# Patient Record
Sex: Female | Born: 2000 | Hispanic: Yes | Marital: Single | Smoking: Never smoker
Health system: Southern US, Community
[De-identification: ages and names within clinical notes are randomized; demographics above are authoritative.]

## PROBLEM LIST (undated history)

## (undated) DIAGNOSIS — H1010 Acute atopic conjunctivitis, unspecified eye: Secondary | ICD-10-CM

## (undated) DIAGNOSIS — Q798 Other congenital malformations of musculoskeletal system: Secondary | ICD-10-CM

## (undated) HISTORY — DX: Acute atopic conjunctivitis, unspecified eye: H10.10

## (undated) HISTORY — PX: NO PAST SURGERIES: SHX2092

---
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
## 2022-01-07 ENCOUNTER — Ambulatory Visit (INDEPENDENT_AMBULATORY_CARE_PROVIDER_SITE_OTHER): Payer: Medicaid Other | Admitting: Physician Assistant

## 2022-01-07 ENCOUNTER — Encounter: Payer: Medicaid Other | Admitting: Physician Assistant

## 2022-01-07 DIAGNOSIS — Q798 Other congenital malformations of musculoskeletal system: Secondary | ICD-10-CM

## 2022-01-07 NOTE — Progress Notes (Signed)
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 Ulice Bold who presents to clinic for postoperative follow-up.  Reviewed operative report and 60 cc was placed into the 300 cc expander.  She was last seen here in clinic on 12/31/2021.  At that time, she was doing well and exam was entirely reassuring.  50 cc was injected into her expander for a total of 110/300 cc.  Plan was for her to return in 1 week for drain removal.  Today, patient is doing well.  Mother at bedside.  Interpreter services also at bedside.  She has had less than 10 cc/day drainage from her JP drain for the past week.  She is hoping they can be removed today.  She also feels prepared for expander fill.  On physical exam, expander in place on chest wall.  No overlying redness or other skin changes.  No subcutaneous fluid collections noted.  Skin is not too taut.  JP drain intact and functional, normal-appearing drainage in bulb.  JP drain is removed without complication or difficulty.  We placed injectable saline in the Expander using a sterile technique: Right: 50 cc for a total of 160 / 330 cc  She already has follow-up planned with Dr. Ulice Bold in 2 weeks.  At that time, she will reassess with a not she would like additional fill.  She is going for symmetry with left breast.  Likely photos at that time.  Patient to call the clinic should she develop any questions or concerns in interim.

## 2022-01-12 NOTE — Progress Notes (Signed)
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  Patient underwent right breast reconstruction with placement of acellular dermal matrix and tissue expander with Dr. Ulice Bold on 12/23/2021.  Patient presents for postoperative follow-up.  Patient was last seen in the clinic on 01/07/2022.  At this visit, patient was doing well.  On exam, there is no subcutaneous fluid or overlying redness.  The left drain that was in place was removed.  Patient underwent expander fill including 50 cc for a total of 160 cc / 330 cc on the right side.  Today, patient is accompanied by her mother.  There is an interpreter present.  Patient reports she is doing really well.  She has no new complaints or concerns.  She denies any issues at the surgical site.  She denies any swelling, redness or drainage.  She states her pain is controlled.  Patient reports that she did well after her last fill.  She would like another fill today.  Patient feels that she is getting close to the size of her other breast.  Chaperone present on exam.  On exam, patient sitting upright in no acute distress.  Right breast is soft and expanders in place.  There is no erythema.  There is no fluid collections noted on exam.  Incision intact with Steri-Strips.  Steri-Strips were removed.  Incision appears to be healing well.  There is no surrounding erythema or drainage.  There is a small Monocryl suture knot that was noted.  This was trimmed.  Patient tolerated well.  We placed injectable saline in the Expander using a sterile technique: Right: 30 cc for a total of 190 cc / 330 cc  Discussed with patient she can place Vaseline daily over the incision site.  Discussed with patient to not submerge the incision.  Discussed with patient to continue to avoid heavy lifting and overhead motion with her arms.  Patient to follow-up in 3 weeks with Dr. Ulice Bold to discuss planning for possible expander  removal and placement of implant and to evaluate the patient.  Pictures were obtained of the patient and placed in the patient's chart with the patient's permission.  All of the patient's and the patient's mother's questions were answered.  Patient also examined by Dr. Ulice Bold.

## 2022-01-14 ENCOUNTER — Ambulatory Visit (INDEPENDENT_AMBULATORY_CARE_PROVIDER_SITE_OTHER): Payer: Medicaid Other | Admitting: Student

## 2022-01-14 ENCOUNTER — Encounter: Payer: Self-pay | Admitting: Plastic Surgery

## 2022-01-14 DIAGNOSIS — Q798 Other congenital malformations of musculoskeletal system: Secondary | ICD-10-CM

## 2022-02-01 ENCOUNTER — Encounter: Payer: Self-pay | Admitting: Plastic Surgery

## 2022-02-01 ENCOUNTER — Ambulatory Visit (INDEPENDENT_AMBULATORY_CARE_PROVIDER_SITE_OTHER): Payer: Medicaid Other | Admitting: Plastic Surgery

## 2022-02-01 DIAGNOSIS — Q839 Congenital malformation of breast, unspecified: Secondary | ICD-10-CM

## 2022-02-01 NOTE — Progress Notes (Signed)
   Subjective:    Patient ID: Sue Gilbert, female    DOB: 21-Mar-2001, 21 y.o.   MRN: [id number]  The patient is an 21 year old female here with mom for follow-up on her right breast reconstruction for [place] syndrome.  She had an expander placed in June and has been undergoing filling.  At the start of her visit she had 160 cc in the 330 cc expander.  She feels like the breasts match in size.      Review of Systems  Constitutional: Negative.   Eyes: Negative.   Cardiovascular: Negative.   Endocrine: Negative.   Genitourinary: Negative.        Objective:   Physical Exam Constitutional:      Appearance: Normal appearance.  Cardiovascular:     Rate and Rhythm: Normal rate.     Pulses: Normal pulses.  Pulmonary:     Effort: Pulmonary effort is normal.  Neurological:     Mental Status: She is alert and oriented to person, place, and time.  Psychiatric:        Mood and Affect: Mood normal.        Behavior: Behavior normal.        Thought Content: Thought content normal.         Assessment & Plan:     ICD-10-CM   1. Congenital breast deformity  Q83.9       We placed injectable saline in the Expander using a sterile technique: Right: 30 cc for a total of 190 / 300 cc.  I would like to see her back more time for a possible fill and we will get her scheduled for surgery in September.

## 2022-02-17 NOTE — Progress Notes (Signed)
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  Patient underwent right breast reconstruction with placement of acellular dermal matrix and tissue expander with Dr. Ulice Bold on 12/23/2021.  Patient presents for postoperative follow-up and possible expander fill.   Patient was last seen in the clinic on 02/01/22. At this visit, patient had her expander filled with 30 cc of injectable saline for a total of 190 cc / 300 cc. Plan was for patient to return for a possible fill and plan to get her on the schedule for surgery in September.   Today, mother is present with the patient and her visit today.  There is a Spanish interpreter in the room for the mother.  Patient today reports she is doing well.  She states that she sometimes has some soreness to her right breast which is positional.  She denies any issues with the area.  She denies any fevers or chills.  Patient reports she is currently happy with the size of her expander and would like to move forward with the implant exchange.  Chaperone present on exam.  On exam, patient is sitting upright in no acute distress.  The right expander is in place.  There is some very mild erythema to the medial aspect.  There are no cellulitic changes to the skin.  There are no underlying fluid collections palpated on exam.  The incision is intact.  There is some dark scaly skin to the inferior aspect of the right breast, which appears consistent with either irritated skin or residual Dermabond.  When asked about the erythema, patient reports that this happens sometimes soon after she wakes up and then eventually goes away.  Patient reports she has been sleeping on her right side.  I discussed with the patient to continue to monitor the area.  I discussed that she should avoid sleeping on that side as this may be causing some of her pain and redness.  I instructed the patient to call if if she notices any changes to  her right breast.  Patient expressed understanding.  Plan is to get patient on the surgery schedule for the implant exchange.  Patient to follow-up at her preop appointment prior to her surgery.  Pictures were obtained of the patient and placed in the chart with the patient's or guardian's permission.   Objective findings and plan discussed with Dr. Ulice Bold

## 2022-02-18 ENCOUNTER — Ambulatory Visit (INDEPENDENT_AMBULATORY_CARE_PROVIDER_SITE_OTHER): Payer: Medicaid Other | Admitting: Student

## 2022-02-18 ENCOUNTER — Encounter: Payer: Self-pay | Admitting: Student

## 2022-02-18 DIAGNOSIS — Q798 Other congenital malformations of musculoskeletal system: Secondary | ICD-10-CM

## 2022-02-22 ENCOUNTER — Telehealth: Payer: Self-pay | Admitting: *Deleted

## 2022-02-22 NOTE — Telephone Encounter (Signed)
Auth request submitted to [facility] Medicaid  [id number] pending as of 02/22/22

## 2022-02-28 ENCOUNTER — Telehealth: Payer: Self-pay | Admitting: *Deleted

## 2022-02-28 NOTE — Telephone Encounter (Signed)
Pre last visit message patient wanted surgery for 03/10/22 so I have scheduled her at 2pm on that day.   LVM for patient to let her know.

## 2022-02-28 NOTE — Telephone Encounter (Signed)
Auth approved for CPT [id number]  [id number] valid 02/22/22 - 05/23/22

## 2022-03-02 ENCOUNTER — Encounter (HOSPITAL_BASED_OUTPATIENT_CLINIC_OR_DEPARTMENT_OTHER): Payer: Self-pay | Admitting: Plastic Surgery

## 2022-03-02 ENCOUNTER — Other Ambulatory Visit: Payer: Self-pay

## 2022-03-03 ENCOUNTER — Encounter: Payer: Self-pay | Admitting: Student

## 2022-03-03 ENCOUNTER — Encounter: Payer: Medicaid Other | Admitting: Physician Assistant

## 2022-03-03 ENCOUNTER — Ambulatory Visit (INDEPENDENT_AMBULATORY_CARE_PROVIDER_SITE_OTHER): Payer: Medicaid Other | Admitting: Student

## 2022-03-03 VITALS — BP 113/71 | HR 73 | Ht 63.0 in | Wt 107.6 lb

## 2022-03-03 DIAGNOSIS — Q798 Other congenital malformations of musculoskeletal system: Secondary | ICD-10-CM

## 2022-03-03 MED ORDER — OXYCODONE HCL 5 MG PO TABS: 5.0000 mg | ORAL_TABLET | Freq: Three times a day (TID) | ORAL | 0 refills | Status: DC | PRN

## 2022-03-03 MED ORDER — ONDANSETRON HCL 4 MG PO TABS: 4.0000 mg | ORAL_TABLET | Freq: Three times a day (TID) | ORAL | 0 refills | Status: DC | PRN

## 2022-03-03 MED ORDER — CEPHALEXIN 500 MG PO CAPS: 500.0000 mg | ORAL_CAPSULE | Freq: Four times a day (QID) | ORAL | 0 refills | Status: AC

## 2022-03-03 NOTE — Progress Notes (Signed)
Patient ID: Sue Gilbert, female    DOB: [birth year], 21 y.o.   MRN: [id number]  Chief Complaint  Patient presents with   Pre-op Exam    No diagnosis found.   History of Present Illness: Sue Gilbert is a 21 y.o.  female  with a history of [place] syndrome.  She presents for preoperative evaluation for upcoming procedure, removal of right tissue expander and placement of implant, scheduled for 03/10/2022 with Dr. Ulice Bold.  Patient is accompanied by her mother today.  Patient states that she is able to interpret any questions that her mother may have during today's visit.  The patient has not had problems with anesthesia.  Patient denies any history of cardiac disease or use of blood thinners.  Patient states she is not a smoker.  Patient states that she is not taking any birth control or hormone replacement.  Patient denies any history of miscarriage.  Patient denies any personal or family history of blood clots or clotting diseases.  Patient denies any recent traumas, surgeries, pregnancies, infections, strokes or heart attacks.  Patient denies any history of inflammatory bowel disease, lung disease or cancer.  Patient denies any recent fevers, chills or shortness of breath.  Patient states that she would like her new implant to be symmetric to her other breast.  Summary of Previous Visit: Patient was last seen in the clinic on 02/18/2022.  Patient at this visit reported that she was happy with the size of her expander and would like to move forward with the implant exchange.  There is 190 cc / 300 cc in her right expander.  Job: Patient states she works at 2 jobs.  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  I discussed with the patient that if she needs any paperwork filled out or if she needs a letter for  either occupation to let us know.  PMH Significant for: [place] syndrome  Past Medical History: Allergies: No Known Allergies  Current Medications:  Current Outpatient Medications:    ondansetron (ZOFRAN) 4 MG tablet, Take 1 tablet (4 mg total) by mouth every 8 (eight) hours as needed for nausea or vomiting. (Patient not taking: Reported on 02/18/2022), Disp: 20 tablet, Rfl: 0  Past Medical Problems: Past Medical History:  Diagnosis Date   Allergic conjunctivitis    age 76   [place] syndrome     Past Surgical History: Past Surgical History:  Procedure Laterality Date   BREAST RECONSTRUCTION WITH PLACEMENT OF TISSUE EXPANDER AND FLEX HD (ACELLULAR HYDRATED DERMIS) Right 12/23/2021   Procedure: BREAST RECONSTRUCTION WITH PLACEMENT OF TISSUE EXPANDER AND FLEX HD (ACELLULAR HYDRATED DERMIS);  Surgeon: Peggye Form, DO;  Location: [facility];  Service: Plastics;  Laterality: Right;  2 hours    Social History: Social History   Socioeconomic History   Marital status: Single    Spouse name: Not on file   Number of children: Not on file   Years of education: Not on file   Highest education level: Not on file  Occupational History   Not on file  Tobacco Use   Smoking status: Never   Smokeless tobacco: Never  Vaping Use   Vaping Use: Never used  Substance and Sexual Activity   Alcohol use: Never  Drug use: Never   Sexual activity: Not on file  Other Topics Concern   Not on file  Social History Narrative   Not on file   Social Determinants of Health   Financial Resource Strain: Not on file  Food Insecurity: Not on file  Transportation Needs: Not on file  Physical Activity: Not on file  Stress: Not on file  Social Connections: Not on file  Intimate Partner Violence: Not on file    Family History: No family history on file.  Review of Systems: Denies fevers, chills or shortness of breath  Physical Exam: Vital Signs BP 113/71 (BP Location:  Left Arm, Patient Position: Sitting, Cuff Size: Normal)   Pulse 73   Ht 5\' 3"  (1.6 m)   Wt 107 lb 9.6 oz (48.8 kg)   LMP 01/11/2022   SpO2 98%   BMI 19.06 kg/m   Physical Exam  Constitutional:      General: Not in acute distress.    Appearance: Normal appearance. Not ill-appearing.  HENT:     Head: Normocephalic and atraumatic.  Neck:     Musculoskeletal: Normal range of motion.  Cardiovascular:     Rate and Rhythm: Normal rate Pulmonary:     Effort: Pulmonary effort is normal. No respiratory distress.  Musculoskeletal: Normal range of motion.  Lower extremities: Nonswollen, no varicose veins noted. Skin:    General: Skin is warm and dry.     Findings: No erythema or rash.  Neurological:     Mental Status: Alert and oriented to person, place, and time. Mental status is at baseline.  Psychiatric:        Mood and Affect: Mood normal.        Behavior: Behavior normal.    Assessment/Plan: The patient is scheduled for 03/10/2022 with Dr. 03/12/2022.  Risks, benefits, and alternatives of procedure discussed, questions answered and consent obtained.    Smoking Status: Non-smoker; Counseling Given?  N/A  Caprini Score: 2; Risk Factors include: length of planned surgery. Recommendation for mechanical prophylaxis. Encourage early ambulation.   Pictures obtained: 02/18/2022  Post-op Rx sent to pharmacy: Oxycodone, Zofran, Keflex  Patient was provided with the [facility] patient decision checklist, the National breast implant registry checklist and the General Surgical Risk consent document and Pain Medication Agreement prior to their appointment.  They had adequate time to read through the risk consent documents and Pain Medication Agreement. We also discussed them in person together during this preop appointment. All of their questions were answered to their satisfaction.  Recommended calling if they have any further questions.  Risk consent form and Pain Medication Agreement to be  scanned into patient's chart.  The risk that can be encountered with breast augmentation were discussed and include the following but not limited to these:  Breast asymmetry, fluid accumulation, firmness of the breast, inability to breast feed, loss of nipple or areola, skin loss, decrease or no nipple sensation, fat necrosis of the breast tissue, bleeding, infection, healing delay.  Deep vein thrombosis, cardiac and pulmonary complications are risks to any procedure. The implant can have a faulty position or one different from what you had desired.  The implant can have rippling, wrinkling, leakage or rupture. There are risks of anesthesia, changes to skin sensation and injury to nerves or blood vessels.  The muscle can be temporarily or permanently injured.  You may have an allergic reaction to tape, suture, glue, blood products which can result in skin discoloration, swelling, pain, skin lesions, poor healing.  Any of these can lead to the need for revisonal surgery or stage procedures.  A reduction has potential to interfere with diagnostic procedures.  Nipple or breast piercing can increase risks of infection.    This procedure is best done when the breast is fully developed.  Changes in the breast will continue to occur over time.  Pregnancy can alter the outcomes of previous breast reduction surgery, weight gain and weigh loss can also effect the long term appearance. Implants are not guaranteed to last a lifetime.  Future surgery may be required.  Regular examinations of the breast are required to evaluate the condition of your breasts and implants.    Electronically signed by: Laurena Spies, PA-C 03/03/2022 2:56 PM

## 2022-03-03 NOTE — H&P (View-Only) (Signed)
   Patient ID: Sue Gilbert, female    DOB: 06/08/2001, 20 y.o.   MRN: [id number]  Chief Complaint  Patient presents with   Pre-op Exam    No diagnosis found.   History of Present Illness: Sue Gilbert is a 20 y.o.  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  She presents for preoperative evaluation for upcoming procedure, removal of right tissue expander and placement of implant, scheduled for 03/10/2022 with Dr. Dillingham.  Patient is accompanied by her mother today.  Patient states that she is able to interpret any questions that her mother may have during today's visit.  The patient has not had problems with anesthesia.  Patient denies any history of cardiac disease or use of blood thinners.  Patient states she is not a smoker.  Patient states that she is not taking any birth control or hormone replacement.  Patient denies any history of miscarriage.  Patient denies any personal or family history of blood clots or clotting diseases.  Patient denies any recent traumas, surgeries, pregnancies, infections, strokes or heart attacks.  Patient denies any history of inflammatory bowel disease, lung disease or cancer.  Patient denies any recent fevers, chills or shortness of breath.  Patient states that she would like her new implant to be symmetric to her other breast.  Summary of Previous Visit: Patient was last seen in the clinic on 02/18/2022.  Patient at this visit reported that she was happy with the size of her expander and would like to move forward with the implant exchange.  There is 190 cc / 300 cc in her right expander.  Job: Patient states she works at 2 jobs.  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  I discussed with the patient that if she needs any paperwork filled out or if she needs a letter for  either occupation to let us know.  PMH Significant for: [place] syndrome  Past Medical History: Allergies: No Known Allergies  Current Medications:  Current Outpatient Medications:    ondansetron (ZOFRAN) 4 MG tablet, Take 1 tablet (4 mg total) by mouth every 8 (eight) hours as needed for nausea or vomiting. (Patient not taking: Reported on 02/18/2022), Disp: 20 tablet, Rfl: 0  Past Medical Problems: Past Medical History:  Diagnosis Date   Allergic conjunctivitis    age 12   [place] syndrome     Past Surgical History: Past Surgical History:  Procedure Laterality Date   BREAST RECONSTRUCTION WITH PLACEMENT OF TISSUE EXPANDER AND FLEX HD (ACELLULAR HYDRATED DERMIS) Right 12/23/2021   Procedure: BREAST RECONSTRUCTION WITH PLACEMENT OF TISSUE EXPANDER AND FLEX HD (ACELLULAR HYDRATED DERMIS);  Surgeon: Dillingham, Hannah Crill S, DO;  Location: [facility];  Service: Plastics;  Laterality: Right;  2 hours    Social History: Social History   Socioeconomic History   Marital status: Single    Spouse name: Not on file   Number of children: Not on file   Years of education: Not on file   Highest education level: Not on file  Occupational History   Not on file  Tobacco Use   Smoking status: Never   Smokeless tobacco: Never  Vaping Use   Vaping Use: Never used  Substance and Sexual Activity   Alcohol use: Never     Drug use: Never   Sexual activity: Not on file  Other Topics Concern   Not on file  Social History Narrative   Not on file   Social Determinants of Health   Financial Resource Strain: Not on file  Food Insecurity: Not on file  Transportation Needs: Not on file  Physical Activity: Not on file  Stress: Not on file  Social Connections: Not on file  Intimate Partner Violence: Not on file    Family History: No family history on file.  Review of Systems: Denies fevers, chills or shortness of breath  Physical Exam: Vital Signs BP 113/71 (BP Location:  Left Arm, Patient Position: Sitting, Cuff Size: Normal)   Pulse 73   Ht 5\' 3"  (1.6 m)   Wt 107 lb 9.6 oz (48.8 kg)   LMP 01/11/2022   SpO2 98%   BMI 19.06 kg/m   Physical Exam  Constitutional:      General: Not in acute distress.    Appearance: Normal appearance. Not ill-appearing.  HENT:     Head: Normocephalic and atraumatic.  Neck:     Musculoskeletal: Normal range of motion.  Cardiovascular:     Rate and Rhythm: Normal rate Pulmonary:     Effort: Pulmonary effort is normal. No respiratory distress.  Musculoskeletal: Normal range of motion.  Lower extremities: Nonswollen, no varicose veins noted. Skin:    General: Skin is warm and dry.     Findings: No erythema or rash.  Neurological:     Mental Status: Alert and oriented to person, place, and time. Mental status is at baseline.  Psychiatric:        Mood and Affect: Mood normal.        Behavior: Behavior normal.    Assessment/Plan: The patient is scheduled for 03/10/2022 with Dr. 03/12/2022.  Risks, benefits, and alternatives of procedure discussed, questions answered and consent obtained.    Smoking Status: Non-smoker; Counseling Given?  N/A  Caprini Score: 2; Risk Factors include: length of planned surgery. Recommendation for mechanical prophylaxis. Encourage early ambulation.   Pictures obtained: 02/18/2022  Post-op Rx sent to pharmacy: Oxycodone, Zofran, Keflex  Patient was provided with the Mentor patient decision checklist, the National breast implant registry checklist and the General Surgical Risk consent document and Pain Medication Agreement prior to their appointment.  They had adequate time to read through the risk consent documents and Pain Medication Agreement. We also discussed them in person together during this preop appointment. All of their questions were answered to their satisfaction.  Recommended calling if they have any further questions.  Risk consent form and Pain Medication Agreement to be  scanned into patient's chart.  The risk that can be encountered with breast augmentation were discussed and include the following but not limited to these:  Breast asymmetry, fluid accumulation, firmness of the breast, inability to breast feed, loss of nipple or areola, skin loss, decrease or no nipple sensation, fat necrosis of the breast tissue, bleeding, infection, healing delay.  Deep vein thrombosis, cardiac and pulmonary complications are risks to any procedure. The implant can have a faulty position or one different from what you had desired.  The implant can have rippling, wrinkling, leakage or rupture. There are risks of anesthesia, changes to skin sensation and injury to nerves or blood vessels.  The muscle can be temporarily or permanently injured.  You may have an allergic reaction to tape, suture, glue, blood products which can result in skin discoloration, swelling, pain, skin lesions, poor healing.  Any of these can lead to the need for revisonal surgery or stage procedures.  A reduction has potential to interfere with diagnostic procedures.  Nipple or breast piercing can increase risks of infection.    This procedure is best done when the breast is fully developed.  Changes in the breast will continue to occur over time.  Pregnancy can alter the outcomes of previous breast reduction surgery, weight gain and weigh loss can also effect the long term appearance. Implants are not guaranteed to last a lifetime.  Future surgery may be required.  Regular examinations of the breast are required to evaluate the condition of your breasts and implants.    Electronically signed by: Laurena Spies, PA-C 03/03/2022 2:56 PM

## 2022-03-10 ENCOUNTER — Other Ambulatory Visit: Payer: Self-pay

## 2022-03-10 ENCOUNTER — Encounter (HOSPITAL_BASED_OUTPATIENT_CLINIC_OR_DEPARTMENT_OTHER): Payer: Self-pay | Admitting: Plastic Surgery

## 2022-03-10 ENCOUNTER — Ambulatory Visit (HOSPITAL_BASED_OUTPATIENT_CLINIC_OR_DEPARTMENT_OTHER): Payer: Medicaid Other | Admitting: Anesthesiology

## 2022-03-10 ENCOUNTER — Ambulatory Visit (HOSPITAL_BASED_OUTPATIENT_CLINIC_OR_DEPARTMENT_OTHER)
Admission: RE | Admit: 2022-03-10 | Discharge: 2022-03-10 | Disposition: A | Discharge status: Home / Self Care | Payer: Medicaid Other | Source: Ambulatory Visit | Attending: Plastic Surgery | Admitting: Plastic Surgery

## 2022-03-10 ENCOUNTER — Encounter (HOSPITAL_BASED_OUTPATIENT_CLINIC_OR_DEPARTMENT_OTHER)
Admission: RE | Disposition: A | Discharge status: Home / Self Care | Payer: Self-pay | Source: Ambulatory Visit | Attending: Plastic Surgery

## 2022-03-10 DIAGNOSIS — Q839 Congenital malformation of breast, unspecified: Secondary | ICD-10-CM | POA: Diagnosis not present

## 2022-03-10 DIAGNOSIS — Q838 Other congenital malformations of breast: Secondary | ICD-10-CM

## 2022-03-10 DIAGNOSIS — Z01818 Encounter for other preprocedural examination: Secondary | ICD-10-CM

## 2022-03-10 DIAGNOSIS — Z9011 Acquired absence of right breast and nipple: Secondary | ICD-10-CM | POA: Diagnosis present

## 2022-03-10 DIAGNOSIS — Q798 Other congenital malformations of musculoskeletal system: Secondary | ICD-10-CM

## 2022-03-10 DIAGNOSIS — Z45811 Encounter for adjustment or removal of right breast implant: Secondary | ICD-10-CM | POA: Diagnosis not present

## 2022-03-10 HISTORY — PX: REMOVAL OF TISSUE EXPANDER AND PLACEMENT OF IMPLANT: SHX6457

## 2022-03-10 LAB — POCT PREGNANCY, URINE: Preg Test, Ur: NEGATIVE

## 2022-03-10 SURGERY — REMOVAL, TISSUE EXPANDER, BREAST, WITH IMPLANT INSERTION
Anesthesia: General | Site: Breast | Laterality: Right

## 2022-03-10 MED ORDER — FENTANYL CITRATE (PF) 100 MCG/2ML IJ SOLN: 25.0000 ug | INTRAMUSCULAR | Status: DC | PRN

## 2022-03-10 MED ORDER — OXYCODONE HCL 5 MG PO TABS
ORAL_TABLET | ORAL | Status: AC
  Filled 2022-03-10: qty 1

## 2022-03-10 MED ORDER — CEPHALEXIN 500 MG PO CAPS: 500.0000 mg | ORAL_CAPSULE | Freq: Four times a day (QID) | ORAL | 0 refills | Status: AC

## 2022-03-10 MED ORDER — DEXAMETHASONE SODIUM PHOSPHATE 4 MG/ML IJ SOLN
INTRAMUSCULAR | Status: DC | PRN
  Administered 2022-03-10: 4 mg via INTRAVENOUS

## 2022-03-10 MED ORDER — ONDANSETRON HCL 4 MG/2ML IJ SOLN
INTRAMUSCULAR | Status: AC
Start: 2022-03-10 — End: ?
  Filled 2022-03-10: qty 2

## 2022-03-10 MED ORDER — SODIUM CHLORIDE 0.9 % IV SOLN: INTRAVENOUS | Status: DC | PRN

## 2022-03-10 MED ORDER — SODIUM CHLORIDE 0.9 % IV SOLN
INTRAVENOUS | Status: AC
  Filled 2022-03-10: qty 10

## 2022-03-10 MED ORDER — CELECOXIB 200 MG PO CAPS: 200.0000 mg | ORAL_CAPSULE | Freq: Once | ORAL | Status: DC

## 2022-03-10 MED ORDER — ACETAMINOPHEN 10 MG/ML IV SOLN
1000.0000 mg | Freq: Once | INTRAVENOUS | Status: AC
  Administered 2022-03-10: 1000 mg via INTRAVENOUS

## 2022-03-10 MED ORDER — FENTANYL CITRATE (PF) 100 MCG/2ML IJ SOLN
INTRAMUSCULAR | Status: AC
  Filled 2022-03-10: qty 2

## 2022-03-10 MED ORDER — KETOROLAC TROMETHAMINE 15 MG/ML IJ SOLN
INTRAMUSCULAR | Status: AC
  Filled 2022-03-10: qty 1

## 2022-03-10 MED ORDER — ACETAMINOPHEN 10 MG/ML IV SOLN
INTRAVENOUS | Status: AC
  Filled 2022-03-10: qty 100

## 2022-03-10 MED ORDER — ACETAMINOPHEN 325 MG PO TABS: 650.0000 mg | ORAL_TABLET | ORAL | Status: DC | PRN

## 2022-03-10 MED ORDER — FENTANYL CITRATE (PF) 100 MCG/2ML IJ SOLN
INTRAMUSCULAR | Status: DC | PRN
  Administered 2022-03-10: 50 ug via INTRAVENOUS
  Administered 2022-03-10 (×2): 25 ug via INTRAVENOUS

## 2022-03-10 MED ORDER — ACETAMINOPHEN 500 MG PO TABS: 1000.0000 mg | ORAL_TABLET | Freq: Once | ORAL | Status: DC

## 2022-03-10 MED ORDER — MIDAZOLAM HCL 2 MG/2ML IJ SOLN
INTRAMUSCULAR | Status: AC
  Filled 2022-03-10: qty 2

## 2022-03-10 MED ORDER — LIDOCAINE HCL (CARDIAC) PF 100 MG/5ML IV SOSY
PREFILLED_SYRINGE | INTRAVENOUS | Status: DC | PRN
  Administered 2022-03-10: 60 mg via INTRAVENOUS

## 2022-03-10 MED ORDER — SODIUM CHLORIDE 0.9% FLUSH: 3.0000 mL | INTRAVENOUS | Status: DC | PRN

## 2022-03-10 MED ORDER — SODIUM CHLORIDE 0.9 % IV SOLN: 250.0000 mL | INTRAVENOUS | Status: DC | PRN

## 2022-03-10 MED ORDER — LIDOCAINE 2% (20 MG/ML) 5 ML SYRINGE
INTRAMUSCULAR | Status: AC
  Filled 2022-03-10: qty 5

## 2022-03-10 MED ORDER — OXYCODONE HCL 5 MG PO TABS
5.0000 mg | ORAL_TABLET | ORAL | Status: DC | PRN
  Administered 2022-03-10: 5 mg via ORAL

## 2022-03-10 MED ORDER — LACTATED RINGERS IV SOLN: INTRAVENOUS | Status: DC

## 2022-03-10 MED ORDER — MIDAZOLAM HCL 5 MG/5ML IJ SOLN
INTRAMUSCULAR | Status: DC | PRN
  Administered 2022-03-10: 2 mg via INTRAVENOUS

## 2022-03-10 MED ORDER — HYDROCODONE-ACETAMINOPHEN 5-325 MG PO TABS: 1.0000 | ORAL_TABLET | Freq: Three times a day (TID) | ORAL | 0 refills | Status: AC | PRN

## 2022-03-10 MED ORDER — PROPOFOL 10 MG/ML IV BOLUS
INTRAVENOUS | Status: DC | PRN
  Administered 2022-03-10: 130 mg via INTRAVENOUS

## 2022-03-10 MED ORDER — CHLORHEXIDINE GLUCONATE CLOTH 2 % EX PADS: 6.0000 | MEDICATED_PAD | Freq: Once | CUTANEOUS | Status: DC

## 2022-03-10 MED ORDER — BUPIVACAINE-EPINEPHRINE 0.25% -1:200000 IJ SOLN
INTRAMUSCULAR | Status: DC | PRN
  Administered 2022-03-10: 5 mL

## 2022-03-10 MED ORDER — BUPIVACAINE-EPINEPHRINE (PF) 0.25% -1:200000 IJ SOLN
INTRAMUSCULAR | Status: AC
  Filled 2022-03-10: qty 30

## 2022-03-10 MED ORDER — KETOROLAC TROMETHAMINE 30 MG/ML IJ SOLN
15.0000 mg | Freq: Once | INTRAMUSCULAR | Status: AC
  Administered 2022-03-10: 15 mg via INTRAVENOUS

## 2022-03-10 MED ORDER — DEXAMETHASONE SODIUM PHOSPHATE 10 MG/ML IJ SOLN
INTRAMUSCULAR | Status: AC
  Filled 2022-03-10: qty 1

## 2022-03-10 MED ORDER — SODIUM CHLORIDE 0.9% FLUSH: 3.0000 mL | Freq: Two times a day (BID) | INTRAVENOUS | Status: DC

## 2022-03-10 MED ORDER — DEXMEDETOMIDINE (PRECEDEX) IN NS 20 MCG/5ML (4 MCG/ML) IV SYRINGE
PREFILLED_SYRINGE | INTRAVENOUS | Status: DC | PRN
  Administered 2022-03-10 (×3): 4 ug via INTRAVENOUS

## 2022-03-10 MED ORDER — CEFAZOLIN SODIUM-DEXTROSE 2-4 GM/100ML-% IV SOLN
2.0000 g | INTRAVENOUS | Status: AC
  Administered 2022-03-10: 2 g via INTRAVENOUS

## 2022-03-10 MED ORDER — ACETAMINOPHEN 325 MG RE SUPP: 650.0000 mg | RECTAL | Status: DC | PRN

## 2022-03-10 MED ORDER — CEFAZOLIN SODIUM-DEXTROSE 2-4 GM/100ML-% IV SOLN
INTRAVENOUS | Status: AC
  Filled 2022-03-10: qty 100

## 2022-03-10 SURGICAL SUPPLY — 67 items
ADH SKN CLS APL DERMABOND .7 (GAUZE/BANDAGES/DRESSINGS) ×1
BAG DECANTER FOR FLEXI CONT (MISCELLANEOUS) ×1 IMPLANT
BINDER BREAST LRG (GAUZE/BANDAGES/DRESSINGS) IMPLANT
BINDER BREAST MEDIUM (GAUZE/BANDAGES/DRESSINGS) IMPLANT
BINDER BREAST XLRG (GAUZE/BANDAGES/DRESSINGS) IMPLANT
BINDER BREAST XXLRG (GAUZE/BANDAGES/DRESSINGS) IMPLANT
BIOPATCH RED 1 DISK 7.0 (GAUZE/BANDAGES/DRESSINGS) IMPLANT
BLADE SURG 15 STRL LF DISP TIS (BLADE) ×1 IMPLANT
BLADE SURG 15 STRL SS (BLADE) ×1
BNDG GAUZE DERMACEA FLUFF 4 (GAUZE/BANDAGES/DRESSINGS) ×2 IMPLANT
BNDG GZE DERMACEA 4 6PLY (GAUZE/BANDAGES/DRESSINGS)
CANISTER SUCT 1200ML W/VALVE (MISCELLANEOUS) ×1 IMPLANT
COVER BACK TABLE 60X90IN (DRAPES) ×1 IMPLANT
COVER MAYO STAND STRL (DRAPES) ×1 IMPLANT
DERMABOND ADVANCED (GAUZE/BANDAGES/DRESSINGS) ×1
DERMABOND ADVANCED .7 DNX12 (GAUZE/BANDAGES/DRESSINGS) IMPLANT
DRAIN CHANNEL 19F RND (DRAIN) IMPLANT
DRAPE LAPAROSCOPIC ABDOMINAL (DRAPES) ×1 IMPLANT
ELECT BLADE 4.0 EZ CLEAN MEGAD (MISCELLANEOUS) ×1
ELECT COATED BLADE 2.86 ST (ELECTRODE) ×1 IMPLANT
ELECT REM PT RETURN 9FT ADLT (ELECTROSURGICAL) ×1
ELECTRODE BLDE 4.0 EZ CLN MEGD (MISCELLANEOUS) ×1 IMPLANT
ELECTRODE REM PT RTRN 9FT ADLT (ELECTROSURGICAL) ×1 IMPLANT
EVACUATOR SILICONE 100CC (DRAIN) IMPLANT
FUNNEL KELLER 2 DISP (MISCELLANEOUS) IMPLANT
GAUZE PAD ABD 8X10 STRL (GAUZE/BANDAGES/DRESSINGS) ×2 IMPLANT
GAUZE SPONGE 4X4 12PLY STRL LF (GAUZE/BANDAGES/DRESSINGS) IMPLANT
GLOVE BIO SURGEON STRL SZ 6.5 (GLOVE) ×2 IMPLANT
GOWN STRL REUS W/ TWL LRG LVL3 (GOWN DISPOSABLE) ×2 IMPLANT
GOWN STRL REUS W/TWL LRG LVL3 (GOWN DISPOSABLE) ×3
IMPL BREAST MP 190CC (Breast) IMPLANT
IMPLANT BREAST MP 190CC (Breast) ×1 IMPLANT
IV NS 1000ML (IV SOLUTION)
IV NS 1000ML BAXH (IV SOLUTION) IMPLANT
NDL HYPO 25X1 1.5 SAFETY (NEEDLE) ×1 IMPLANT
NDL SAFETY ECLIP 18X1.5 (MISCELLANEOUS) ×1 IMPLANT
NEEDLE HYPO 25X1 1.5 SAFETY (NEEDLE) ×1 IMPLANT
PACK BASIN DAY SURGERY FS (CUSTOM PROCEDURE TRAY) ×1 IMPLANT
PENCIL SMOKE EVACUATOR (MISCELLANEOUS) ×1 IMPLANT
PIN SAFETY STERILE (MISCELLANEOUS) IMPLANT
SIZER BREAST REUSABLE 190CC (SIZER) ×1
SIZER BREAST REUSE 175CC (SIZER) ×1
SIZER BRST REUSABLE 190CC (SIZER) IMPLANT
SIZER BRST REUSE 175CC (SIZER) IMPLANT
SLEEVE SCD COMPRESS KNEE MED (STOCKING) ×1 IMPLANT
SPIKE FLUID TRANSFER (MISCELLANEOUS) IMPLANT
SPONGE T-LAP 18X18 ~~LOC~~+RFID (SPONGE) ×2 IMPLANT
STRIP SUTURE WOUND CLOSURE 1/2 (MISCELLANEOUS) IMPLANT
SUT MNCRL AB 3-0 PS2 18 (SUTURE) IMPLANT
SUT MNCRL AB 4-0 PS2 18 (SUTURE) ×1 IMPLANT
SUT MON AB 3-0 SH 27 (SUTURE) ×1
SUT MON AB 3-0 SH27 (SUTURE) ×1 IMPLANT
SUT MON AB 5-0 PS2 18 (SUTURE) IMPLANT
SUT PDS 3-0 CT2 (SUTURE) ×1
SUT PDS AB 2-0 CT2 27 (SUTURE) IMPLANT
SUT PDS II 3-0 CT2 27 ABS (SUTURE) IMPLANT
SUT SILK 3 0 PS 1 (SUTURE) IMPLANT
SUT VIC AB 3-0 SH 27 (SUTURE)
SUT VIC AB 3-0 SH 27X BRD (SUTURE) ×1 IMPLANT
SUT VICRYL 4-0 PS2 18IN ABS (SUTURE) ×1 IMPLANT
SYR BULB IRRIG 60ML STRL (SYRINGE) ×1 IMPLANT
SYR CONTROL 10ML LL (SYRINGE) IMPLANT
TOWEL GREEN STERILE FF (TOWEL DISPOSABLE) ×2 IMPLANT
TRAY DSU PREP LF (CUSTOM PROCEDURE TRAY) ×1 IMPLANT
TUBE CONNECTING 20X1/4 (TUBING) ×1 IMPLANT
UNDERPAD 30X36 HEAVY ABSORB (UNDERPADS AND DIAPERS) ×2 IMPLANT
YANKAUER SUCT BULB TIP NO VENT (SUCTIONS) ×1 IMPLANT

## 2022-03-10 NOTE — Anesthesia Preprocedure Evaluation (Signed)
Anesthesia Evaluation  Patient identified by MRN, date of birth, ID band Patient awake    Reviewed: Allergy & Precautions, NPO status , Patient's Chart, lab work & pertinent test results  Airway Mallampati: II  TM Distance: >3 FB Neck ROM: Full    Dental no notable dental hx.    Pulmonary neg pulmonary ROS,    Pulmonary exam normal        Cardiovascular negative cardio ROS   Rhythm:Regular Rate:Normal     Neuro/Psych negative neurological ROS  negative psych ROS   GI/Hepatic negative GI ROS, Neg liver ROS,   Endo/Other  negative endocrine ROS  Renal/GU negative Renal ROS  negative genitourinary   Musculoskeletal Congenital breast deformity   Abdominal Normal abdominal exam  (+)   Peds negative pediatric ROS (+)  Hematology negative hematology ROS (+)   Anesthesia Other Findings   Reproductive/Obstetrics                             Anesthesia Physical Anesthesia Plan  ASA: 2  Anesthesia Plan: General   Post-op Pain Management: Celebrex PO (pre-op)* and Tylenol PO (pre-op)*   Induction: Intravenous  PONV Risk Score and Plan: 3 and Ondansetron, Dexamethasone, Midazolam and Treatment may vary due to age or medical condition  Airway Management Planned: Mask and LMA  Additional Equipment: None  Intra-op Plan:   Post-operative Plan: Extubation in OR  Informed Consent: I have reviewed the patients History and Physical, chart, labs and discussed the procedure including the risks, benefits and alternatives for the proposed anesthesia with the patient or authorized representative who has indicated his/her understanding and acceptance.     Dental advisory given  Plan Discussed with: CRNA  Anesthesia Plan Comments: (Lab Results      Component                Value               Date                      PREGTESTUR               NEGATIVE            03/10/2022          )         Anesthesia Quick Evaluation

## 2022-03-10 NOTE — Op Note (Signed)
Op report Unilateral Breast Exchange   DATE OF OPERATION:  03/10/2022  LOCATION: [facility]  SURGICAL DIVISION: Plastic Surgery  PREOPERATIVE DIAGNOSES:  1. 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  2. Absence of Right breast  POSTOPERATIVE DIAGNOSES:  Same as preoperative diagnosis  PROCEDURE:  1. Exchange of tissue expander for implant. 2. Capsulotomies for implant respositioning.  SURGEON: Dolores Ewing Sanger Rayane Gallardo, DO  ASSISTANT: Burna Forts, PA  ANESTHESIA:  General.   COMPLICATIONS: None.   IMPLANTS: Mentor Smooth Moderate Plus Profile xtra Gel 190 cc. Ref #[id number].  Serial Number [id number]  INDICATIONS FOR PROCEDURE:  The patient, Sue Gilbert, is a 21 y.o. female born on [birth year], is here for further treatment after a placement of a tissue expander. She now presents for exchange of her expander for an implant.   MRN: [id number]  CONSENT:  Informed consent was obtained directly from the patient. Risks, benefits and alternatives were fully discussed. Specific risks including but not limited to bleeding, infection, hematoma, seroma, scarring, pain, implant infection, implant extrusion, capsular contracture, asymmetry, wound healing problems, and need for further surgery were all discussed. The patient did have an ample opportunity to have her questions answered to her satisfaction.   DESCRIPTION OF PROCEDURE:  The patient was taken to the operating room. SCDs were placed and IV antibiotics were given. The patient's chest was prepped and draped in a sterile fashion. A time out was performed and the implants to be used were identified.  One percent Xylocaine with epinephrine was used to infiltrate the area.   The previous incision was opened at the inframammary fold. The pectoralis was split to expose the tissue expander which was removed. Inspection of the pocket showed a normal healthy capsule and good integration of the biologic matrix.    Measurements were made to confirm adequate pocket size for the implant dimensions.  Hemostasis was ensured with electrocautery.  The pocket was irrigated with antibiotic solution.  New gloves were placed.  The implant was placed in the pocket with the Keller funnel and oriented appropriately. The capsule on the anterior surface was closed with a 3-0 running PDS suture. The remaining skin was closed with 3-0 Monocryl deep dermal and 4-0 Monocryl subcuticular stitches.  Dermabond was applied.  A breast binder and ABD was applied.  The patient was allowed to wake from anesthesia and taken to the recovery room in satisfactory condition.   The advanced practice practitioner (APP) assisted throughout the case.  The APP was essential in retraction and counter traction when needed to make the case progress smoothly.  This retraction and assistance made it possible to see the tissue plans for the procedure.  The assistance was needed for blood control, tissue re-approximation and assisted with closure of the incision site.

## 2022-03-10 NOTE — Discharge Instructions (Addendum)
INSTRUCTIONS FOR AFTER BREAST SURGERY   You will likely have some questions about what to expect following your operation.  The following information will help you and your family understand what to expect when you are discharged from the hospital.  Following these guidelines will help ensure a smooth recovery and reduce risks of complications.  Postoperative instructions include information on: diet, wound care, medications and physical activity.  AFTER SURGERY Expect to go home after the procedure.  In some cases, you may need to spend one night in the hospital for observation.  DIET Breast surgery does not require a specific diet.  However, the healthier you eat the better your body can start healing. It is important to increasing your protein intake.  This means limiting the foods with sugar and carbohydrates.  Focus on vegetables and some meat.  If you have any liposuction during your procedure be sure to drink water.  If your urine is bright yellow, then it is concentrated, and you need to drink more water.  As a general rule after surgery, you should have 8 ounces of water every hour while awake.  If you find you are persistently nauseated or unable to take in liquids let us know.  NO TOBACCO USE or EXPOSURE.  This will slow your healing process and increase the risk of a wound.  WOUND CARE Leave the binder on for 3 days . Use fragrance free soap.   After 3 days you can remove the binder to shower. Once dry apply ACE wrap, binder or sports bra.  Use a mild soap like Dial, Dove and Ivory. You may have Topifoam or Lipofoam on.  It is soft and spongy and helps keep you from getting creases if you have liposuction.  This can be removed before the shower and then replaced.  If you need more it is available on [employer] (Lipofoam). If you have steri-strips / tape directly attached to your skin leave them in place. It is OK to get these wet.   No baths, pools or hot tubs for four weeks. We close your  incision to leave the smallest and best-looking scar. No ointment or creams on your incisions until given the go ahead.  Especially not Neosporin (Too many skin reactions with this one).  A few weeks after surgery you can use Mederma and start massaging the scar. We ask you to wear your binder or sports bra for the first 6 weeks around the clock, including while sleeping. This provides added comfort and helps reduce the fluid accumulation at the surgery site.  ACTIVITY No heavy lifting until cleared by the doctor.  This usually means no more than a half-gallon of milk.  It is OK to walk and climb stairs. In fact, moving your legs is very important to decrease your risk of a blood clot.  It will also help keep you from getting deconditioned.  Every 1 to 2 hours get up and walk for 5 minutes. This will help with a quicker recovery back to normal.  Let pain be your guide so you don't do too much.  This is not the time for spring cleaning and don't plan on taking care of anyone else.  This time is for you to recover,  You will be more comfortable if you sleep and rest with your head elevated either with a few pillows under you or in a recliner.  No stomach sleeping for a three months.  WORK Everyone returns to work at different times.   As a rough guide, most people take at least 1 - 2 weeks off prior to returning to work. If you need documentation for your job, bring the forms to your postoperative follow up visit.  DRIVING Arrange for someone to bring you home from the hospital.  You may be able to drive a few days after surgery but not while taking any narcotics or valium.  BOWEL MOVEMENTS Constipation can occur after anesthesia and while taking pain medication.  It is important to stay ahead for your comfort.  We recommend taking Milk of Magnesia (2 tablespoons; twice a day) while taking the pain pills.  MEDICATIONS You may be prescribed should start after surgery At your preoperative visit for you  history and physical you may have been given the following medications: An antibiotic: Start this medication when you get home and take according to the instructions on the bottle. Zofran 4 mg:  This is to treat nausea and vomiting.  You can take this every 6 hours as needed and only if needed. Valium 2 mg: This is for muscle tightness if you have an implant or expander. This will help relax your muscle which also helps with pain control.  This can be taken every 12 hours as needed. Don't drive after taking this medication. Norco (hydrocodone/acetaminophen) 5/325 mg:  This is only to be used after you have taken the motrin or the tylenol. Every 8 hours as needed.   Over the counter Medication to take: Ibuprofen (Motrin) 600 mg:  Take this every 6 hours.  If you have additional pain then take 500 mg of the tylenol every 8 hours.  Only take the Norco after you have tried these two. Miralax or stool softener of choice: Take this according to the bottle if you take the Norco.  WHEN TO CALL Call your surgeon's office if any of the following occur: Fever 101 degrees F or greater Excessive bleeding or fluid from the incision site. Pain that increases over time without aid from the medications Redness, warmth, or pus draining from incision sites Persistent nausea or inability to take in liquids Severe misshapen area that underwent the operation.   Post Anesthesia Home Care Instructions  Activity: Get plenty of rest for the remainder of the day. A responsible individual must stay with you for 24 hours following the procedure.  For the next 24 hours, DO NOT: -Drive a car -Operate machinery -Drink alcoholic beverages -Take any medication unless instructed by your physician -Make any legal decisions or sign important papers.  Meals: Start with liquid foods such as gelatin or soup. Progress to regular foods as tolerated. Avoid greasy, spicy, heavy foods. If nausea and/or vomiting occur, drink  only clear liquids until the nausea and/or vomiting subsides. Call your physician if vomiting continues.  Special Instructions/Symptoms: Your throat may feel dry or sore from the anesthesia or the breathing tube placed in your throat during surgery. If this causes discomfort, gargle with warm salt water. The discomfort should disappear within 24 hours.  If you had a scopolamine patch placed behind your ear for the management of post- operative nausea and/or vomiting:  1. The medication in the patch is effective for 72 hours, after which it should be removed.  Wrap patch in a tissue and discard in the trash. Wash hands thoroughly with soap and water. 2. You may remove the patch earlier than 72 hours if you experience unpleasant side effects which may include dry mouth, dizziness or visual disturbances. 3. Avoid touching   the patch. Wash your hands with soap and water after contact with the patch.   

## 2022-03-10 NOTE — Interval H&P Note (Signed)
History and Physical Interval Note:  03/10/2022 2:17 PM  Sue Gilbert  has presented today for surgery, with the diagnosis of Congenital breast deformity, [place] Syndrome.  The various methods of treatment have been discussed with the patient and family. After consideration of risks, benefits and other options for treatment, the patient has consented to  Procedure(s) with comments: REMOVAL OF R TISSUE EXPANDER AND PLACEMENT OF IMPLANT (Right) - Requesting 1 hour as a surgical intervention.  The patient's history has been reviewed, patient examined, no change in status, stable for surgery.  I have reviewed the patient's chart and labs.  Questions were answered to the patient's satisfaction.     Alena Bills Gala Padovano

## 2022-03-10 NOTE — Anesthesia Procedure Notes (Signed)
Procedure Name: LMA Insertion Date/Time: 03/10/2022 3:00 PM  Performed by: Burna Cash, CRNAPre-anesthesia Checklist: Patient identified, Emergency Drugs available, Suction available and Patient being monitored Patient Re-evaluated:Patient Re-evaluated prior to induction Oxygen Delivery Method: Circle system utilized Preoxygenation: Pre-oxygenation with 100% oxygen Induction Type: IV induction Ventilation: Mask ventilation without difficulty LMA: LMA inserted LMA Size: 3.0 Number of attempts: 1 Airway Equipment and Method: Bite block Placement Confirmation: positive ETCO2 Tube secured with: Tape Dental Injury: Teeth and Oropharynx as per pre-operative assessment

## 2022-03-10 NOTE — Transfer of Care (Signed)
Immediate Anesthesia Transfer of Care Note  Patient: Sue Gilbert  Procedure(s) Performed: REMOVAL OF RIGHT TISSUE EXPANDER AND PLACEMENT OF IMPLANT (Right: Breast)  Patient Location: PACU  Anesthesia Type:General  Level of Consciousness: sedated  Airway & Oxygen Therapy: Patient Spontanous Breathing and Patient connected to face mask oxygen  Post-op Assessment: Report given to RN and Post -op Vital signs reviewed and stable  Post vital signs: Reviewed and stable  Last Vitals:  Vitals Value Taken Time  BP    Temp    Pulse 55 03/10/22 1547  Resp 25 03/10/22 1547  SpO2 100 % 03/10/22 1547  Vitals shown include unvalidated device data.  Last Pain:  Vitals:   03/10/22 1216  TempSrc: Oral  PainSc: 0-No pain         Complications: No notable events documented.

## 2022-03-14 ENCOUNTER — Encounter (HOSPITAL_BASED_OUTPATIENT_CLINIC_OR_DEPARTMENT_OTHER): Payer: Self-pay | Admitting: Plastic Surgery

## 2022-03-14 NOTE — Anesthesia Postprocedure Evaluation (Signed)
Anesthesia Post Note  Patient: Sue Gilbert  Procedure(s) Performed: REMOVAL OF RIGHT TISSUE EXPANDER AND PLACEMENT OF IMPLANT (Right: Breast)     Patient location during evaluation: PACU Anesthesia Type: General Level of consciousness: awake and alert Pain management: pain level controlled Vital Signs Assessment: post-procedure vital signs reviewed and stable Respiratory status: spontaneous breathing, nonlabored ventilation, respiratory function stable and patient connected to nasal cannula oxygen Cardiovascular status: blood pressure returned to baseline and stable Postop Assessment: no apparent nausea or vomiting Anesthetic complications: no   No notable events documented.  Last Vitals:  Vitals:   03/10/22 1615 03/10/22 1700  BP: 112/71 121/77  Pulse: (!) 49 77  Resp: 17   Temp:  36.6 C  SpO2: 100% 100%    Last Pain:  Vitals:   03/11/22 1040  TempSrc:   PainSc: 0-No pain                 Earl Lites P Adamaris King

## 2022-03-21 ENCOUNTER — Encounter: Payer: Self-pay | Admitting: Physician Assistant

## 2022-03-21 ENCOUNTER — Ambulatory Visit (INDEPENDENT_AMBULATORY_CARE_PROVIDER_SITE_OTHER): Payer: Medicaid Other | Admitting: Physician Assistant

## 2022-03-21 DIAGNOSIS — Q839 Congenital malformation of breast, unspecified: Secondary | ICD-10-CM

## 2022-03-21 NOTE — Progress Notes (Signed)
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 Ulice Bold.  She had a Mentor smooth moderate plus profile extra gel 190 cc implant placed.  Postoperatively the patient notes she is doing well, she denies any significant pain, she denies any infectious symptoms.  She does note that the right side seems a little smaller when compared to the left but overall is happy.  Chaperone present.  On exam right breast is round, soft, with no palpable fluid collections or area of firmness.  Incision is covered in Steri-Strips, no surrounding redness warmth or discharge.  Overall the patient appears to be doing very well.  She did have her honeycomb dressing removed, her Steri-Strips were left in place.  She is okay to shower and allow these to come off with time.  I would like to see her back in the office in 2 weeks for repeat evaluation.  She is given strict return precautions, both the patient and her mother verbalized understanding and agreement to today's plan had no further questions or concerns.  We will obtain postop photos at her next visit.

## 2022-04-05 ENCOUNTER — Encounter: Payer: Self-pay | Admitting: Physician Assistant

## 2022-04-05 ENCOUNTER — Ambulatory Visit (INDEPENDENT_AMBULATORY_CARE_PROVIDER_SITE_OTHER): Payer: Medicaid Other | Admitting: Physician Assistant

## 2022-04-05 DIAGNOSIS — Q839 Congenital malformation of breast, unspecified: Secondary | ICD-10-CM

## 2022-04-05 NOTE — Progress Notes (Signed)
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 Marla Roe.  She had a Mentor smooth moderate plus profile extra gel 190 cc implant placed.  Postoperatively she has done very well, she was seen in our office on 03/21/2022 and had no issues or concerns at that time.  Chaperone present, exam right breast is round soft with no palpable fluid collections or area of firmness.  Incisions are clean dry and intact with no surrounding redness warmth or discharge.  Photos place in chart today   Overall the patient is happy with her surgical outcome.  Dr. Marla Roe had the opportunity to come evaluate the patient.  The patient does feel that the right breast is slightly smaller than the left.  Given her anatomy it would be difficult to find perfect symmetry, we did discuss that if she is unhappy with the shape that some lipo filling would likely benefit.  At this point the patient is happy, we would like to see her back in our office for 1 month and then a subsequent 18-month follow-up.  The patient will follow-up immediately if she develops any new or worsening signs or symptoms.  She verbalized understanding and agreement to today's plan had no further questions or concerns.

## 2022-05-05 ENCOUNTER — Other Ambulatory Visit (HOSPITAL_COMMUNITY)
Admission: RE | Admit: 2022-05-05 | Discharge: 2022-05-05 | Disposition: A | Discharge status: Home / Self Care | Payer: Medicaid Other | Source: Ambulatory Visit | Attending: Pediatrics | Admitting: Pediatrics

## 2022-05-05 ENCOUNTER — Ambulatory Visit (INDEPENDENT_AMBULATORY_CARE_PROVIDER_SITE_OTHER): Payer: Medicaid Other | Admitting: Pediatrics

## 2022-05-05 ENCOUNTER — Encounter: Payer: Self-pay | Admitting: Pediatrics

## 2022-05-05 VITALS — BP 116/70 | Ht 63.5 in | Wt 105.4 lb

## 2022-05-05 DIAGNOSIS — Z1339 Encounter for screening examination for other mental health and behavioral disorders: Secondary | ICD-10-CM | POA: Diagnosis not present

## 2022-05-05 DIAGNOSIS — Z131 Encounter for screening for diabetes mellitus: Secondary | ICD-10-CM | POA: Diagnosis not present

## 2022-05-05 DIAGNOSIS — Z114 Encounter for screening for human immunodeficiency virus [HIV]: Secondary | ICD-10-CM

## 2022-05-05 DIAGNOSIS — Z23 Encounter for immunization: Secondary | ICD-10-CM

## 2022-05-05 DIAGNOSIS — Z68.41 Body mass index (BMI) pediatric, 5th percentile to less than 85th percentile for age: Secondary | ICD-10-CM

## 2022-05-05 DIAGNOSIS — Z113 Encounter for screening for infections with a predominantly sexual mode of transmission: Secondary | ICD-10-CM

## 2022-05-05 DIAGNOSIS — Z0000 Encounter for general adult medical examination without abnormal findings: Secondary | ICD-10-CM | POA: Diagnosis not present

## 2022-05-05 DIAGNOSIS — Z1331 Encounter for screening for depression: Secondary | ICD-10-CM

## 2022-05-05 LAB — POCT RAPID HIV: Rapid HIV, POC: NEGATIVE

## 2022-05-05 NOTE — Progress Notes (Signed)
Adolescent Well Care Visit Sue Gilbert is a 21 y.o. female who is here for well care.    PCP:  Sue Custard, MD   History was provided by the patient.  Current Issues: Current concerns include her mother is concerned that she may develop diabetes because of how much sugar she has in her diet.  Sue Gilbert's father has type 2 diabetes..   Nutrition/Exercise: Nutrition/Eating Behaviors: appetite is good Play any Sports?/ Exercise: walking at work a lot at work  Sleep:  Sleep: no concerns  Social Screening: Lives with:  parents Parental relations:  good Activities, Work, and Regulatory affairs officer?: has chores, works 2 jobs  Concerns regarding behavior with peers?  no Stressors of note: no  Education: School Name: [employer] Grade: first year - studying nursing  Menstruation:   No LMP recorded. (Menstrual status: Irregular Periods). Menstrual History: LMP was 04/22/22, has a period about every 5-6 weeks, no concerns   Confidential Social History: Tobacco?  no Secondhand smoke exposure?  no Drugs/ETOH?  no  Sexually Active?  no   Pregnancy Prevention: abstinence  Screenings: Patient has a dental home: yes  The patient completed the Rapid Assessment for Adolescent Preventive Services screening questionnaire and the following topics were identified as risk factors and discussed: none  In addition, the following topics were discussed as part of anticipatory guidance healthy eating, condom use, and birth control.  PHQ-9 completed and results indicated no signs of depression  Physical Exam:  Vitals:   05/05/22 1547  BP: 116/70  Weight: 105 lb 6 oz (47.8 kg)  Height: 5' 3.5" (1.613 m)   BP 116/70   Ht 5' 3.5" (1.613 m)   Wt 105 lb 6 oz (47.8 kg)   BMI 18.37 kg/m  Body mass index: body mass index is 18.37 kg/m. Blood pressure %iles are not available for patients who are 18 years or older.  Hearing Screening   500Hz  1000Hz  2000Hz  4000Hz   Right ear 20 20 20 20   Left  ear 20 20 20 20    Vision Screening   Right eye Left eye Both eyes  Without correction 20/20 20/20 20/20   With correction       General Appearance:   alert, oriented, no acute distress  HENT: Normocephalic, no obvious abnormality, conjunctiva clear  Mouth:   Normal appearing teeth, no obvious discoloration, dental caries, or dental caps  Neck:   Supple; thyroid: no enlargement, symmetric, no tenderness/mass/nodules  Chest Not examined  Lungs:   Clear to auscultation bilaterally, normal work of breathing  Heart:   Regular rate and rhythm, S1 and S2 normal, no murmurs;   Abdomen:   Soft, non-tender, no mass, or organomegaly  GU genitalia not examined  Musculoskeletal:   Tone and strength strong and symmetrical, all extremities               Lymphatic:   No cervical adenopathy  Skin/Hair/Nails:   Skin warm, dry and intact, no rashes, no bruises or petechiae  Neurologic:   Strength, gait, and coordination normal and age-appropriate     Assessment and Plan:   1. Encounter for general adult medical examination without abnormal findings  2. Body mass index, pediatric, 5th percentile to less than 85th percentile for age   24. Routine screening for STI (sexually transmitted infection) Patient denies sexual activity - at risk age group. - Urine cytology ancillary only  4. Screening for HIV (human immunodeficiency virus) Routine screening  - POCT Rapid HIV - negative  BMI is appropriate for age  Hearing screening result:normal Vision screening result: normal  Counseling provided for all of the vaccine components  Orders Placed This Encounter  Procedures   Flu Vaccine QUAD 19mo+IM (Fluarix, Fluzone & Alfiuria Quad PF)     Return if symptoms worsen or fail to improve.Sue End, MD

## 2022-05-05 NOTE — Patient Instructions (Signed)
Adult Primary Care Clinics Name Criteria Services   [facility]  Address: [address] [place], [place] [address]  Phone: [phone] Hours: Monday - Friday 9 AM -6 PM  Types of insurance accepted:  Commercial insurance [facility] (orange card) Medicaid Medicare Uninsured  Language services:  Video and phone interpreters available   Ages 18 and older    Adult primary care Onsite pharmacy Integrated behavioral health Financial assistance counseling Walk-in hours for established patients  Financial assistance counseling hours: Tuesdays 2:00PM - 5:00PM  Thursday 8:30AM - 4:30PM  Space is limited, 10 on Tuesday and 20 on Thursday on a first come, first serve basis  Name Criteria Services   [facility]  Address: [address] [place], [place] [address]  Phone: [phone]  Hours: Monday - Friday 8:30 AM - 5 PM  Types of insurance accepted:  Commercial insurance Medicaid Medicare Uninsured  Language services:  Video and phone interpreters available   All ages - newborn to adult   Primary care for all ages (children and adults) Integrated behavioral health Nutritionist Financial assistance counseling   Name Criteria Services   [facility]  Located on the ground floor of [facility]  Address: [address]  [place],  [place]  [address]  Phone: [phone]  Hours: Monday - Friday 8:15 AM - 5 PM  Types of insurance accepted:  Commercial insurance Medicaid Medicare Uninsured  Language services:  Video and phone interpreters available   Ages 18 and older   Adult primary care Nutritionist Certified Diabetes Educator  Integrated behavioral health Financial assistance counseling   Name Criteria Services   [facility]  Address: [address] [place], [place] [address]  Phone:  [phone]  Hours: Monday - Friday 8:30 AM - 5 PM    Types of insurance accepted:  Commercial insurance Medicaid Medicare Uninsured  Language services:  Video and phone interpreters available   All ages - newborn to adult   Primary care for all ages (children and adults) Integrated behavioral health Financial assistance counseling    

## 2022-05-06 LAB — URINE CYTOLOGY ANCILLARY ONLY
Chlamydia: NEGATIVE
Comment: NEGATIVE
Comment: NORMAL
Neisseria Gonorrhea: NEGATIVE

## 2022-05-11 ENCOUNTER — Ambulatory Visit (INDEPENDENT_AMBULATORY_CARE_PROVIDER_SITE_OTHER): Payer: Medicaid Other | Admitting: Physician Assistant

## 2022-05-11 ENCOUNTER — Encounter: Payer: Self-pay | Admitting: Physician Assistant

## 2022-05-11 DIAGNOSIS — Q839 Congenital malformation of breast, unspecified: Secondary | ICD-10-CM

## 2022-05-11 NOTE — Progress Notes (Signed)
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 Marla Roe.  She had a Mentor smooth moderate plus profile extra gel 190 cc implant placed.  Postoperatively she has done very well, she was last seen in the office on 04/05/2022.  Since that time she has had no concerns or complaints.  She notes she is happy with her surgical outcome and does not want any further intervention.  Chaperone present, on exam right breast is round with no overlying redness, incisions clean dry and intact with routine healing.  Overall the patient is happy with the surgical outcome she does not want any further intervention at this time.  She will follow-up in our office in 6 months for follow-up evaluation.  She will reach out sooner if she develops any concerning signs or symptoms.

## 2022-06-07 ENCOUNTER — Ambulatory Visit (INDEPENDENT_AMBULATORY_CARE_PROVIDER_SITE_OTHER): Payer: Medicaid Other | Admitting: Pediatrics

## 2022-06-07 ENCOUNTER — Encounter: Payer: Self-pay | Admitting: Pediatrics

## 2022-06-07 VITALS — Temp 98.4°F | Wt 107.4 lb

## 2022-06-07 DIAGNOSIS — R21 Rash and other nonspecific skin eruption: Secondary | ICD-10-CM | POA: Diagnosis not present

## 2022-06-07 MED ORDER — CETIRIZINE HCL 10 MG PO TABS: 10.0000 mg | ORAL_TABLET | Freq: Every day | ORAL | 2 refills | Status: AC | PRN

## 2022-06-07 NOTE — Progress Notes (Unsigned)
  Subjective:    Sue Gilbert is a 21 y.o. old female here with her {family members:11419} for Rash (Face rash ) .    HPI Chief Complaint  Patient presents with   Rash    Face rash    Rash on face and facial swelling for the past 2 days.  Also having itchinesss, dryness, and acne breakout.  She reports that this has happened 5 times in the past year (first episode was in February).  This is not associated with eating any certain foods.  Using cerave acne control cleanser for the past 5 months.  Using makeup and makeup remover.  Used a different foundation on Sunday when the rash started, but had the rash before starting to use this product.  She has tried applying chamomile.    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  Sue Gilbert  has a past medical history of Allergic conjunctivitis and [place] syndrome.  Immunizations needed: {NONE DEFAULTED:18576}     Objective:    Temp 98.4 F (36.9 C) (Oral)   Wt 107 lb 6.4 oz (48.7 kg)   LMP 04/05/2022   BMI 18.73 kg/m  Physical Exam     Assessment and Plan:   Sue Gilbert is a 21 y.o. old female with  ***   Return if symptoms worsen or fail to improve.  Clifton Custard, MD

## 2022-06-12 IMAGING — CR DG CHEST 2V
2 series · 2 of 2 positions shown · non-contrast
Comparison: None.

CLINICAL DATA: Chest deformity with congenital right breast
absence. [place] syndrome. Preoperative study for reconstruction
surgery.

EXAM:
CHEST - 2 VIEW

[chest pa]
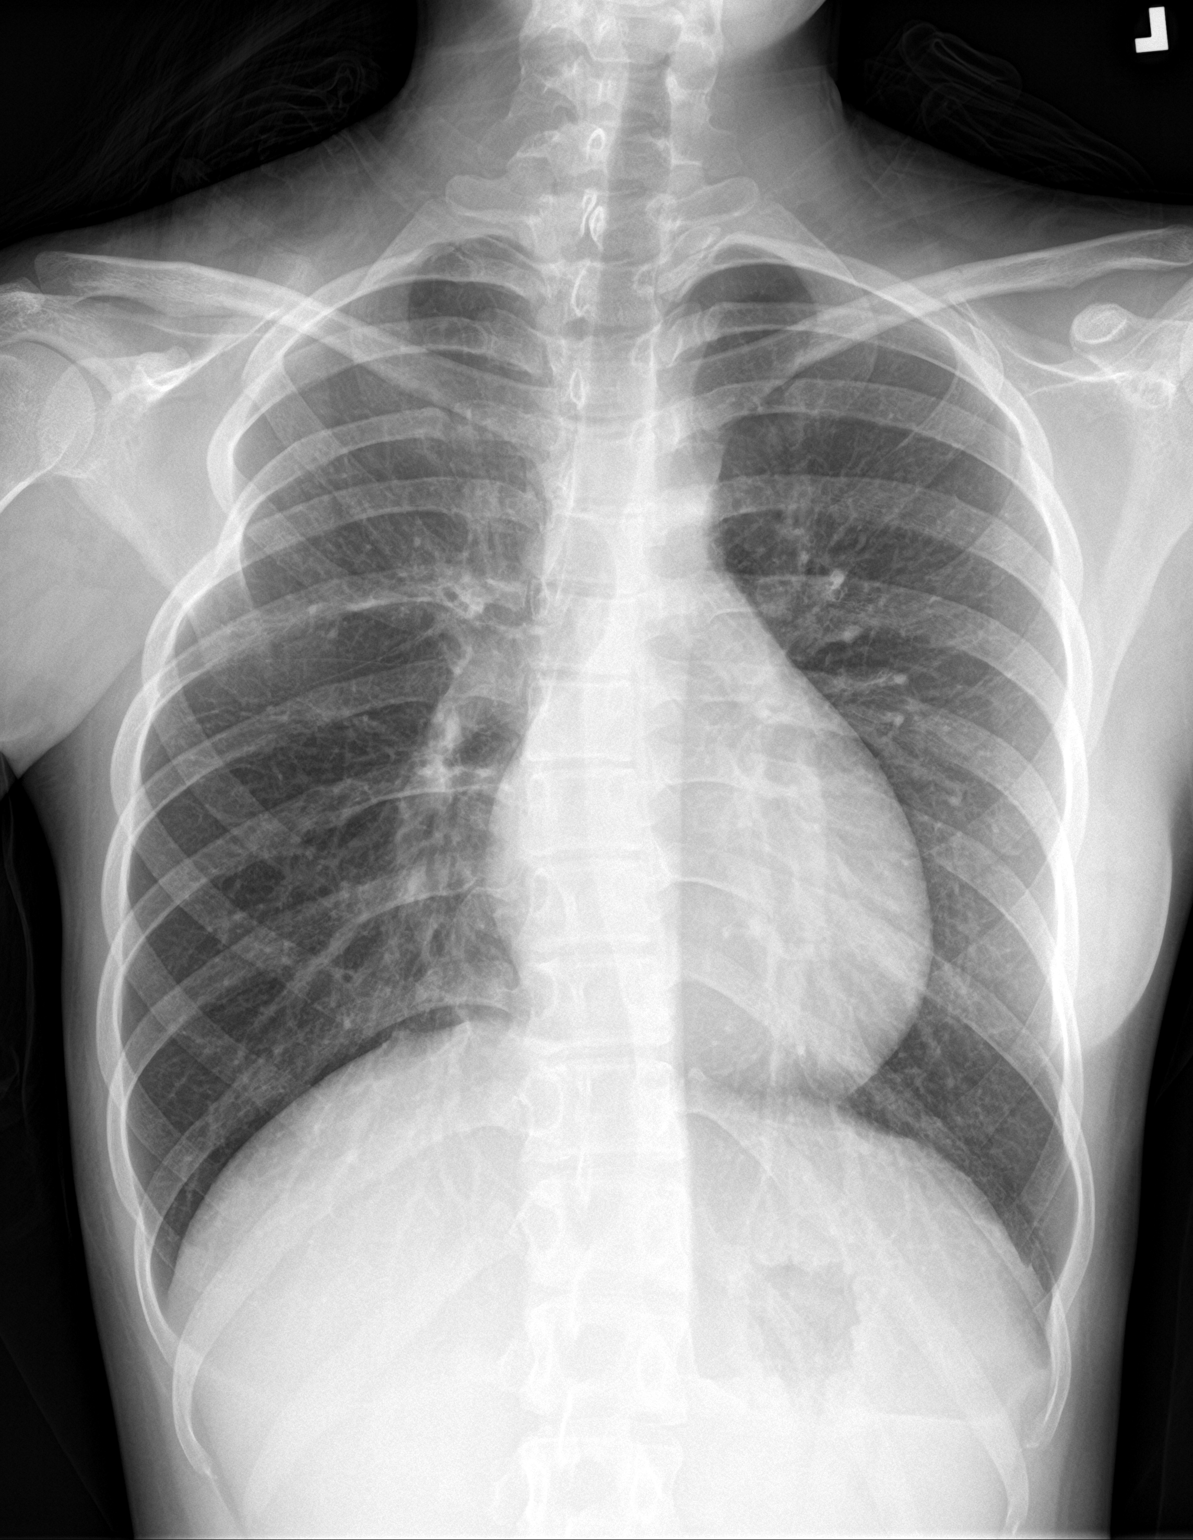

[chest lat]
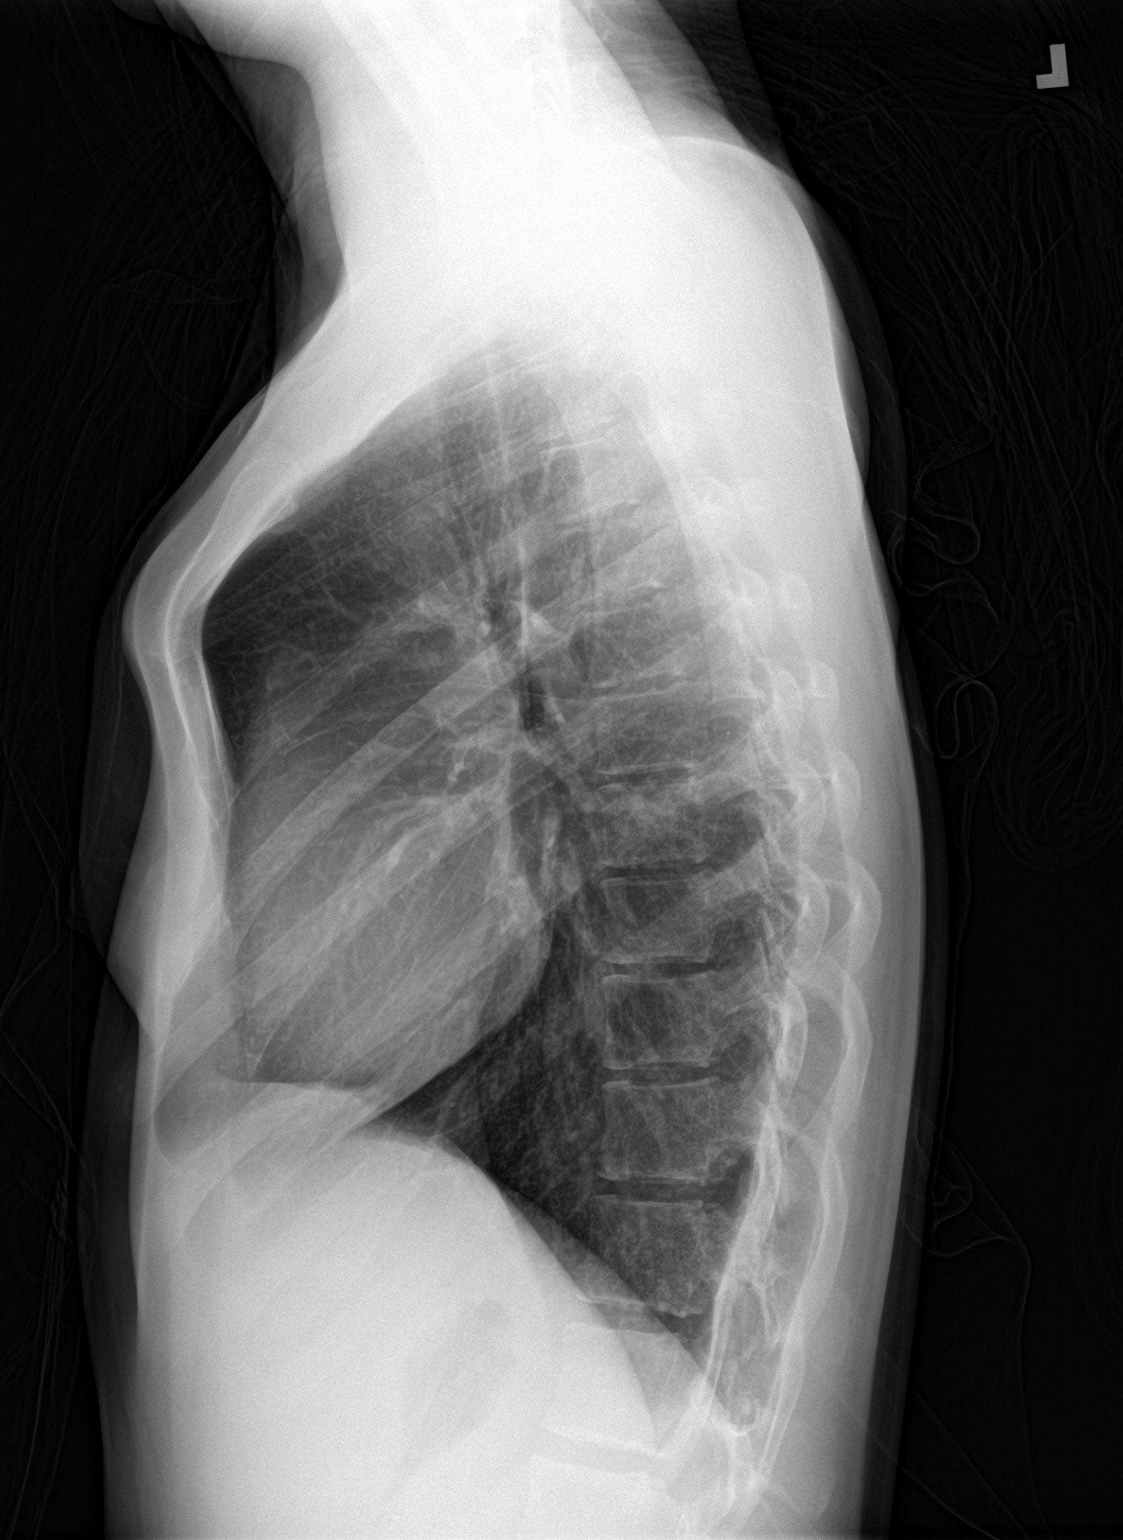

[2 of 2 positions shown; findings below may reference images not displayed]

FINDINGS: Mediastinum and hilar structures normal. Heart size normal. No
pulmonary venous congestion. No focal alveolar infiltrate. No
pleural effusion or pneumothorax. Mild thoracic spine scoliosis
concave left. The right fourth rib is hypoplastic. Pectus carinatum
noted. No right breast tissue noted.
IMPRESSION: 1.  No acute cardiopulmonary disease.

2. Hypoplastic right fourth rib. Pectus carinatum. No right breast
tissue visualized, this is consistent with the patient's history.

## 2022-06-22 DIAGNOSIS — H5213 Myopia, bilateral: Secondary | ICD-10-CM | POA: Diagnosis not present

## 2022-07-13 DIAGNOSIS — H52223 Regular astigmatism, bilateral: Secondary | ICD-10-CM | POA: Diagnosis not present

## 2022-07-13 DIAGNOSIS — H5213 Myopia, bilateral: Secondary | ICD-10-CM | POA: Diagnosis not present

## 2022-07-22 ENCOUNTER — Other Ambulatory Visit: Payer: Self-pay

## 2022-07-22 ENCOUNTER — Ambulatory Visit (INDEPENDENT_AMBULATORY_CARE_PROVIDER_SITE_OTHER): Payer: Medicaid Other | Admitting: Internal Medicine

## 2022-07-22 ENCOUNTER — Encounter: Payer: Self-pay | Admitting: Internal Medicine

## 2022-07-22 DIAGNOSIS — L308 Other specified dermatitis: Secondary | ICD-10-CM

## 2022-07-22 DIAGNOSIS — L2389 Allergic contact dermatitis due to other agents: Secondary | ICD-10-CM | POA: Diagnosis not present

## 2022-07-22 MED ORDER — HYDROCORTISONE 2.5 % EX CREA: TOPICAL_CREAM | Freq: Two times a day (BID) | CUTANEOUS | 0 refills | Status: AC

## 2022-07-22 NOTE — Patient Instructions (Addendum)
Contact Dermatitis  -Allergy testing today showed: positive to tree pollen and molds  - Start avoidance measures as below  -Will have patient follow-up for patch testing Patches are best placed on Monday with return to office on Wednesday and Friday of same week for readings.  Patches once placed should not get wet.  You do not have to stop any medications for patch testing but should not be on oral prednisone. You can schedule a patch testing visit when convenient for your schedule.    Treatment plan for flares:  - Hydrocortisone 2.5% cream twice a day until rash resolves - Sometimes prednisone (oral steroids) are needed for severe flares, if the cream doesn't work give [place] a call   Follow up: for patch testing   Thank you so much for letting me partake in your care today.  Don't hesitate to reach out if you have any additional concerns!  Roney Marion, MD  [facility] [place], [place]  Reducing Pollen Exposure  The American Academy of Allergy, Asthma and Immunology suggests the following steps to reduce your exposure to pollen during allergy seasons.    Do not hang sheets or clothing out to dry; pollen may collect on these items. Do not mow lawns or spend time around freshly cut grass; mowing stirs up pollen. Keep windows closed at night.  Keep car windows closed while driving. Minimize morning activities outdoors, a time when pollen counts are usually at their highest. Stay indoors as much as possible when pollen counts or humidity is high and on windy days when pollen tends to remain in the air longer. Use air conditioning when possible.  Many air conditioners have filters that trap the pollen spores. Use a HEPA room air filter to remove pollen form the indoor air you breathe.  Control of Mold Allergen   Mold and fungi can grow on a variety of surfaces provided certain temperature and moisture conditions exist.  Outdoor molds grow on plants, decaying vegetation and  soil.  The major outdoor mold, Alternaria and Cladosporium, are found in very high numbers during hot and dry conditions.  Generally, a late Summer - Fall peak is seen for common outdoor fungal spores.  Rain will temporarily lower outdoor mold spore count, but counts rise rapidly when the rainy period ends.  The most important indoor molds are Aspergillus and Penicillium.  Dark, humid and poorly ventilated basements are ideal sites for mold growth.  The next most common sites of mold growth are the bathroom and the kitchen.   Indoor (Perennial) Mold Control   Positive indoor molds via skin testing: Aspergillus, Penicillium, Fusarium, Aureobasidium (Pullulara), and Rhizopus  Maintain humidity below 50%. Clean washable surfaces with 5% bleach solution. Remove sources e.g. contaminated carpets.

## 2022-07-22 NOTE — Progress Notes (Signed)
New Patient Note  RE: Sue Gilbert MRN: [id number] DOB: July 01, 2001 Date of Office Visit: 07/22/2022  Consult requested by: Carmie End, MD Primary care provider: Carmie End, MD  Chief Complaint: Rash  History of Present Illness: I had the pleasure of seeing Sue Gilbert for initial evaluation at the [facility] on 07/22/2022. She is a 22 y.o. female, who is referred here by Ettefagh, Paul Dykes, MD for the evaluation of rash .  History obtained from patient, chart review.   Rash:   Started 1 year ago  Only occurs on face  Rash is pruritic and will progress to "puss filled pimples"  Has occurred 3 times and will last 1-2 weeks  Denies any known triggers  Will treats with chamomile tea bag and oil free moisturizer  Denies any prior history of rashes, eczema or dry skin  She does get acrylic nails done about 5 times a year, denies any association with nails and onset of rash  Pictures show erythematous eczematous patches on cheeks with some small blisters    Assessment and Plan: [name] is a 22 y.o. female with: Allergic contact dermatitis due to other agents - Plan: Allergy Test, Interdermal Allergy Test  Other specified dermatitis - Plan: Interdermal Allergy Test Plan: Patient Instructions  Contact Dermatitis  -Allergy testing today showed: positive to tree pollen and molds  - Start avoidance measures as below  -Will have patient follow-up for patch testing Patches are best placed on Monday with return to office on Wednesday and Friday of same week for readings.  Patches once placed should not get wet.  You do not have to stop any medications for patch testing but should not be on oral prednisone. You can schedule a patch testing visit when convenient for your schedule.    Treatment plan for flares:  - Hydrocortisone 2.5% cream twice a day until rash resolves - Sometimes prednisone (oral steroids) are needed for severe  flares, if the cream doesn't work give [place] a call   Follow up: for patch testing   Thank you so much for letting me partake in your care today.  Don't hesitate to reach out if you have any additional concerns!  Roney Marion, MD  [facility] [place] [place]  Reducing Pollen Exposure  The American Academy of Allergy, Asthma and Immunology suggests the following steps to reduce your exposure to pollen during allergy seasons.    Do not hang sheets or clothing out to dry; pollen may collect on these items. Do not mow lawns or spend time around freshly cut grass; mowing stirs up pollen. Keep windows closed at night.  Keep car windows closed while driving. Minimize morning activities outdoors, a time when pollen counts are usually at their highest. Stay indoors as much as possible when pollen counts or humidity is high and on windy days when pollen tends to remain in the air longer. Use air conditioning when possible.  Many air conditioners have filters that trap the pollen spores. Use a HEPA room air filter to remove pollen form the indoor air you breathe.  Control of Mold Allergen   Mold and fungi can grow on a variety of surfaces provided certain temperature and moisture conditions exist.  Outdoor molds grow on plants, decaying vegetation and soil.  The major outdoor mold, Alternaria and Cladosporium, are found in very high numbers during hot and dry conditions.  Generally, a late Summer - Fall peak is  seen for common outdoor fungal spores.  Rain will temporarily lower outdoor mold spore count, but counts rise rapidly when the rainy period ends.  The most important indoor molds are Aspergillus and Penicillium.  Dark, humid and poorly ventilated basements are ideal sites for mold growth.  The next most common sites of mold growth are the bathroom and the kitchen.   Indoor (Perennial) Mold Control   Positive indoor molds via skin testing: Aspergillus, Penicillium, Fusarium,  Aureobasidium (Pullulara), and Rhizopus  Maintain humidity below 50%. Clean washable surfaces with 5% bleach solution. Remove sources e.g. contaminated carpets.       Meds ordered this encounter  Medications   hydrocortisone 2.5 % cream    Sig: Apply topically 2 (two) times daily.    Dispense:  30 g    Refill:  0   Lab Orders  No laboratory test(s) ordered today    Other allergy screening: Asthma: no Rhino conjunctivitis: no Food allergy: no Medication allergy: no Hymenoptera allergy: no Urticaria: no Eczema:no History of recurrent infections suggestive of immunodeficency: no  Diagnostics: Skin Testing: Environmental allergy panel and select foods. Epicutaneous testing positive to tree pollen And intradermal testing positive to mold mix 2 and mold mix 4                                                         Results interpreted by myself and discussed with patient/family.  Airborne Adult Perc - 07/22/22 0900     Time Antigen Placed 1610    Allergen Manufacturer Waynette Buttery    Location Back    Number of Test 59    1. Control-Buffer 50% Glycerol Negative    2. Control-Histamine 1 mg/ml 4+    3. Albumin saline Negative    4. Bahia Negative    5. French Southern Territories Negative    6. Johnson Negative    7. Kentucky Blue Negative    8. Meadow Fescue Negative    9. Perennial Rye Negative    10. Sweet Vernal Negative    11. Timothy Negative    12. Cocklebur Negative    13. Burweed Marshelder Negative    14. Ragweed, short Negative    15. Ragweed, Giant Negative    16. Plantain,  English Negative    17. Lamb's Quarters Negative    18. Sheep Sorrell Negative    19. Rough Pigweed Negative    20. Marsh Elder, Rough Negative    21. Mugwort, Common Negative    22. Ash mix Negative    23. Birch mix Negative    24. Beech American Negative    25. Box, Elder Negative    26. Cedar, red Negative    27. Cottonwood, Guinea-Bissau Negative    28. Elm mix Negative    29. Hickory 4+    30. Maple  mix 3+    31. Oak, Guinea-Bissau mix 4+    32. Pecan Pollen Negative    33. Pine mix Negative    34. Sycamore Eastern Negative    35. Walnut, Black Pollen Negative    36. Alternaria alternata Negative    37. Cladosporium Herbarum Negative    38. Aspergillus mix Negative    39. Penicillium mix Negative    40. Bipolaris sorokiniana (Helminthosporium) Negative    41. Drechslera spicifera (Curvularia) Negative  42. Mucor plumbeus Negative    43. Fusarium moniliforme Negative    44. Aureobasidium pullulans (pullulara) Negative    45. Rhizopus oryzae Negative    46. Botrytis cinera Negative    47. Epicoccum nigrum Negative    48. Phoma betae Negative    49. Candida Albicans Negative    50. Trichophyton mentagrophytes Negative    51. Mite, D Farinae  5,000 AU/ml Negative    52. Mite, D Pteronyssinus  5,000 AU/ml Negative    53. Cat Hair 10,000 BAU/ml Negative    54.  Dog Epithelia Negative    55. Mixed Feathers Negative    56. Horse Epithelia Negative    57. Cockroach, German Negative    58. Mouse Negative    59. Tobacco Leaf Negative             Food Perc - 07/22/22 0900       Test Information   Time Antigen Placed 0998    Allergen Manufacturer Waynette Buttery    Location Back    Number of allergen test 10      Food   1. Peanut Negative    2. Soybean food Negative    3. Wheat, whole Negative    4. Sesame Negative    5. Milk, cow Negative    6. Egg White, chicken Negative    7. Casein Negative    8. Shellfish mix Negative    9. Fish mix Negative    10. 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   Weight loss, unintentional 04/06/2021   Acne vulgaris 04/06/2021   Irregular menses 03/17/2020   Wears glasses 11/04/2016   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   Past Medical History:  Diagnosis Date   Allergic conjunctivitis    age 31   [place] syndrome    Past Surgical History: Past Surgical History:  Procedure Laterality Date   BREAST RECONSTRUCTION WITH PLACEMENT OF TISSUE EXPANDER AND FLEX HD (ACELLULAR HYDRATED DERMIS) Right 12/23/2021   Procedure: BREAST RECONSTRUCTION WITH PLACEMENT OF TISSUE EXPANDER AND FLEX HD (ACELLULAR HYDRATED DERMIS);  Surgeon: Peggye Form, DO;  Location: [facility];  Service: Plastics;  Laterality: Right;  2 hours   REMOVAL OF TISSUE EXPANDER AND PLACEMENT OF IMPLANT Right 03/10/2022   Procedure: REMOVAL OF RIGHT TISSUE EXPANDER AND PLACEMENT OF IMPLANT;  Surgeon: Peggye Form, DO;  Location:  SURGERY CENTER;  Service: Plastics;  Laterality: Right;  Requesting 1 hour   Medication List:  Current Outpatient Medications  Medication Sig Dispense Refill   hydrocortisone 2.5 % cream Apply  topically 2 (two) times daily. 30 g 0   cetirizine (ZYRTEC) 10 MG tablet Take 1 tablet (10 mg total) by mouth daily as needed for allergies (or itching). (Patient not taking: Reported on 07/22/2022) 30 tablet 2   No current facility-administered medications for this visit.   Allergies: No Known Allergies Social History: Social History   Socioeconomic History   Marital status: Single    Spouse name: Not on file   Number of children: Not on file   Years of education: Not on file   Highest education level: Not on file  Occupational History   Not on file  Tobacco Use   Smoking status: Never    Passive  exposure: Never   Smokeless tobacco: Never  Vaping Use   Vaping Use: Never used  Substance and Sexual Activity   Alcohol use: Never   Drug use: Never   Sexual activity: Not on file  Other Topics Concern   Not on file  Social History Narrative   Not on file   Social Determinants of Health   Financial Resource Strain: Not on file  Food Insecurity: Not on file  Transportation Needs: Not on file  Physical Activity: Not on file  Stress: Not on file  Social Connections: Not on file   Lives in a single-family home is built in 1995.  There are no roaches in the house and bed is to be off the floor.  There are no dust mite precautions on bed or pillows.  She is not exposed to fumes, chemicals or dust.  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 No pertinent family history.   ROS: All others negative except as noted per HPI.   Objective: There were no vitals taken for this visit. There is no height or weight on file to calculate BMI.  General Appearance:  Alert, cooperative, no distress, appears stated age  Head:  Normocephalic, without obvious abnormality, atraumatic  Eyes:  Conjunctiva clear, EOM's intact  Nose: Nares normal, normal mucosa, no visible anterior polyps, and septum midline  Throat: Lips, tongue normal; teeth and gums normal, normal posterior oropharynx and no tonsillar exudate  Neck: Supple, symmetrical  Lungs:   clear to auscultation bilaterally, Respirations unlabored, no coughing  Heart:  regular rate and rhythm and no murmur, Appears well perfused  Extremities: No edema  Skin: Skin color, texture, turgor normal, no rashes or lesions on  visualized portions of skin  Neurologic: No gross deficits   The plan was reviewed with the patient/family, and all questions/concerned were addressed.  It was my pleasure to see Sue Gilbert today and participate in her care. Please feel free to contact me with any questions or concerns.  Sincerely,  Roney Marion, MD Allergy & Immunology  [facility] office: [phone] [facility] office: [phone]

## 2022-08-15 ENCOUNTER — Encounter: Payer: Self-pay | Admitting: Family Medicine

## 2022-08-15 ENCOUNTER — Other Ambulatory Visit: Payer: Self-pay

## 2022-08-15 ENCOUNTER — Ambulatory Visit (INDEPENDENT_AMBULATORY_CARE_PROVIDER_SITE_OTHER): Payer: Medicaid Other | Admitting: Family Medicine

## 2022-08-15 VITALS — BP 118/70 | HR 79 | Temp 98.9°F | Resp 16 | Wt 103.8 lb

## 2022-08-15 DIAGNOSIS — L2389 Allergic contact dermatitis due to other agents: Secondary | ICD-10-CM | POA: Insufficient documentation

## 2022-08-15 NOTE — Progress Notes (Signed)
Follow-up Note  RE: Sue Gilbert MRN: [id number] DOB: [birth year] Date of Office Visit: 08/15/2022  Primary care provider: Carmie End, MD Referring provider: Carmie End, MD   Sue Gilbert returns to the office today for the patch test placement, given suspected history of contact dermatitis. She reports that about once every 4-6 months she develops redness on her face that progresses to pruritic areas which eventually form "pimple like" areas filled with exudate. The areas heal without scarring or discoloration. She denies new products, foods, medications, and insect stings. Her current medications are listed in the chart.   Diagnostics: True Test patches placed.   Plan:   Allergic contact dermatitis - Instructions provided on care of the patches for the next 48 hours. Sue Gilbert was instructed to avoid showering for the next 48 hours. Sue Gilbert will follow up in 48 hours and 96 hours for patch readings.   Call the clinic if this treatment plan is not working well for you  Follow up in 2 days or sooner if needed.  Thank you for the opportunity to care for this patient.  Please do not hesitate to contact me with questions.  Gareth Morgan, FNP [facility]

## 2022-08-15 NOTE — Patient Instructions (Addendum)
Diagnostics: True Test patches placed.   Plan:   Allergic contact dermatitis - Instructions provided on care of the patches for the next 48 hours. Sue Gilbert was instructed to avoid showering for the next 48 hours. Sue Gilbert will follow up in 48 hours and 96 hours for patch readings.   Call the clinic if this treatment plan is not working well for you  Follow up in 2 days or sooner if needed.

## 2022-08-16 NOTE — Progress Notes (Unsigned)
   Follow Up Note  RE: Sue Gilbert MRN: [id number] DOB: 05/07/2001 Date of Office Visit: 08/17/2022  Referring provider: Carmie End, MD Primary care provider: Carmie End, MD  History of Present Illness: I had the pleasure of seeing Sue Gilbert for a follow up visit at the [facility] [place] [facility] [place] on 08/17/2022. She is a 22 y.o. female, who is being followed for dermatitis. Today she is here for initial patch test interpretation, given suspected history of contact dermatitis.   Diagnostics:  TRUE TEST 48 hour reading:   T.R.U.E. Test - 08/17/22 0800       Test Information   Time Antigen Placed 0850    Manufacturer Greer    Lot # [id number]    Location Back    Number of Test 36    Reading Interval Day 1;Day 3;Day 5    Panel Panel 1;Panel 2;Panel 3      Panel 1   1. Nickel Sulfate 0    2. Wool Alcohols 0    3. Neomycin Sulfate 0    4. Potassium Dichromate 0    5. Caine Mix 0    6. Fragrance Mix 0    7. Colophony 0    8. Paraben Mix 0    9. Negative Control 0    10. Balsam of [place] 0    11. Ethylenediamine Dihydrochloride 0    12. Cobalt Dichloride 0      Panel 2   13. p-tert Butylphenol Formaldehyde Resin 0    14. Epoxy Resin 0    15. Carba Mix 0    16.  Black Rubber Mix 0    17. Cl+ Me-Isothiazolinone 0    18. Quaternium-15 0    19. Methyldibromo Glutaronitrile 0    20. p-Phenylenediamine 0    21. Formaldehyde 0    22. Mercapto Mix 0    23. Thimerosal 0    24. Thiuram Mix 0      Panel 3   25. Diazolidinyl Urea 0    26. Quinoline Mix 0    27. Tixocortol-21-Pivalate 0    28. Gold Sodium Thiosulfate 0    29. Imidazolidinyl Urea 0    30. Budesonide 0    31. Hydrocortisone-17-Butyrate 0    32. Mercaptobenzothiazole 0    33. Bacitracin 0    34. Parthenolide 0    35. Disperse Blue 106 0    36. 2-Bromo-2-Nitropropane-1,3-diol 0              Assessment and Plan: Sue Gilbert is a 22 y.o. female with: Allergic contact  dermatitis due to other agents Removed TRUE patches. 48 hour reading was all negative.   Return in about 2 days (around 08/19/2022) for Patch reading.  It was my pleasure to see Sue Gilbert today and participate in her care. Please feel free to contact me with any questions or concerns.  Sincerely,  Rexene Alberts, DO Allergy & Immunology  [facility] office: [phone] [facility] office: [place] office: [phone]

## 2022-08-17 ENCOUNTER — Encounter: Payer: Self-pay | Admitting: Allergy

## 2022-08-17 ENCOUNTER — Ambulatory Visit: Payer: Medicaid Other | Admitting: Allergy

## 2022-08-17 DIAGNOSIS — L2389 Allergic contact dermatitis due to other agents: Secondary | ICD-10-CM

## 2022-08-17 NOTE — Assessment & Plan Note (Signed)
Removed TRUE patches. 48 hour reading was all negative.

## 2022-08-19 ENCOUNTER — Ambulatory Visit: Payer: Medicaid Other | Admitting: Allergy

## 2022-08-19 ENCOUNTER — Encounter: Payer: Self-pay | Admitting: Allergy

## 2022-08-19 ENCOUNTER — Other Ambulatory Visit: Payer: Self-pay

## 2022-08-19 DIAGNOSIS — L2389 Allergic contact dermatitis due to other agents: Secondary | ICD-10-CM

## 2022-08-19 NOTE — Progress Notes (Signed)
    Follow-up Note  RE: Sue Gilbert MRN: [id number] DOB: 2000/09/09 Date of Office Visit: 08/19/2022  Primary care provider: Carmie End, MD Referring provider: Carmie End, MD   [name] returns to the office today for the final patch test interpretation, given suspected history of contact dermatitis.    Diagnostics:  TRUE TEST 96 hour reading: negative  T.R.U.E. Test     Row Name 08/19/22 0800           Time Antigen Placed 1478       Manufacturer Greer       Lot # [id number]       Location Back       Number of Test 36       Reading Interval Day 5       Panel Panel 1;Panel 2;Panel 3       1. Nickel Sulfate 0       2. Wool Alcohols 0       3. Neomycin Sulfate 0       4. Potassium Dichromate 0       5. Caine Mix 0       6. Fragrance Mix 0       7. Colophony 0       8. Paraben Mix 0       9. Negative Control 0       10. Balsam of [place] 0       11. Ethylenediamine Dihydrochloride 0       12. Cobalt Dichloride 0       13. p-tert Butylphenol Formaldehyde Resin 0       14. Epoxy Resin 0       15. Carba Mix 0       16.  Black Rubber Mix 0       17. Cl+ Me-Isothiazolinone 0       18. Quaternium-15 0       19. Methyldibromo Glutaronitrile 0       20. p-Phenylenediamine 0       21. Formaldehyde 0       22. Mercapto Mix 0       23. Thimerosal 0       24. Thiuram Mix 0       25. Diazolidinyl Urea 0       26. Quinoline Mix 0       27. Tixocortol-21-Pivalate 0       28. Gold Sodium Thiosulfate 0       29. Imidazolidinyl Urea 0       30. Budesonide 0       31. Hydrocortisone-17-Butyrate 0       32. Mercaptobenzothiazole 0       33. Bacitracin 0       34. Parthenolide 0       35. Disperse Blue 106 0       36. 2-Bromo-2-Nitropropane-1,3-diol 0                   Plan:  Allergic contact dermatitis Patch testing to TRUE test was negative at this time.   Prudy Feeler, MD [facility]

## 2022-11-11 ENCOUNTER — Ambulatory Visit: Payer: Self-pay | Admitting: Plastic Surgery

## 2023-06-14 ENCOUNTER — Encounter (HOSPITAL_COMMUNITY): Payer: Self-pay

## 2023-06-14 ENCOUNTER — Emergency Department (HOSPITAL_COMMUNITY)
Admission: EM | Admit: 2023-06-14 | Discharge: 2023-06-14 | Disposition: A | Discharge status: Home / Self Care | Payer: Self-pay | Attending: Emergency Medicine | Admitting: Emergency Medicine

## 2023-06-14 ENCOUNTER — Other Ambulatory Visit: Payer: Self-pay

## 2023-06-14 DIAGNOSIS — K625 Hemorrhage of anus and rectum: Secondary | ICD-10-CM | POA: Insufficient documentation

## 2023-06-14 LAB — COMPREHENSIVE METABOLIC PANEL
ALT: 17 U/L (ref 0–44)
AST: 16 U/L (ref 15–41)
Albumin: 4.5 g/dL (ref 3.5–5.0)
Alkaline Phosphatase: 67 U/L (ref 38–126)
Anion gap: 9 (ref 5–15)
BUN: 15 mg/dL (ref 6–20)
CO2: 24 mmol/L (ref 22–32)
Calcium: 9.7 mg/dL (ref 8.9–10.3)
Chloride: 105 mmol/L (ref 98–111)
Creatinine, Ser: 0.81 mg/dL (ref 0.44–1.00)
GFR, Estimated: >60 mL/min (ref >60)
Glucose, Bld: 102 mg/dL — ABNORMAL HIGH (ref 70–99)
Potassium: 4.1 mmol/L (ref 3.5–5.1)
Sodium: 138 mmol/L (ref 135–145)
Total Bilirubin: 0.3 mg/dL (ref <1.2)
Total Protein: 8.2 g/dL — ABNORMAL HIGH (ref 6.5–8.1)

## 2023-06-14 LAB — CBC WITH DIFFERENTIAL/PLATELET
Abs Immature Granulocytes: 0.02 10*3/uL (ref 0.00–0.07)
Basophils Absolute: 0 10*3/uL (ref 0.0–0.1)
Basophils Relative: 1 %
Eosinophils Absolute: 0.1 10*3/uL (ref 0.0–0.5)
Eosinophils Relative: 1 %
HCT: 39.5 % (ref 36.0–46.0)
Hemoglobin: 13.8 g/dL (ref 12.0–15.0)
Immature Granulocytes: 0 %
Lymphocytes Relative: 26 %
Lymphs Abs: 1.7 10*3/uL (ref 0.7–4.0)
MCH: 30.9 pg (ref 26.0–34.0)
MCHC: 34.9 g/dL (ref 30.0–36.0)
MCV: 88.6 fL (ref 80.0–100.0)
Monocytes Absolute: 0.5 10*3/uL (ref 0.1–1.0)
Monocytes Relative: 8 %
Neutro Abs: 4.3 10*3/uL (ref 1.7–7.7)
Neutrophils Relative %: 64 %
Platelets: 320 10*3/uL (ref 150–400)
RBC: 4.46 MIL/uL (ref 3.87–5.11)
RDW: 12.7 % (ref 11.5–15.5)
WBC: 6.6 10*3/uL (ref 4.0–10.5)
nRBC: 0 % (ref 0.0–0.2)

## 2023-06-14 LAB — POC OCCULT BLOOD, ED: Fecal Occult Bld: NEGATIVE

## 2023-06-14 NOTE — ED Provider Notes (Signed)
[place] EMERGENCY DEPARTMENT AT [facility] Provider Note   CSN: [id number] Arrival date & time: 06/14/23  1210     History  Chief Complaint  Patient presents with   Rectal Bleeding    Sue Gilbert is a 22 y.o. female.  The history is provided by the patient.  Rectal Bleeding Quality:  Bright red Duration:  1 week Timing:  Intermittent Chronicity:  New Context: defecation and rectal pain   Context: not foreign body   Relieved by:  Nothing Worsened by:  Defecation Associated symptoms: abdominal pain and vomiting   Associated symptoms: no fever    Pt reports for about a week she has had painful rectal bleeding No trauma No fever She reports pain is so severe she vomits  She reports she just had her menstrual cycle that has resolved  Home Medications Prior to Admission medications   Medication Sig Start Date End Date Taking? Authorizing Provider  cetirizine (ZYRTEC) 10 MG tablet Take 1 tablet (10 mg total) by mouth daily as needed for allergies (or itching). 06/07/22   Ettefagh, Aron Baba, MD  hydrocortisone 2.5 % cream Apply topically 2 (two) times daily. 07/22/22   Ferol Luz, MD      Allergies    Patient has no known allergies.    Review of Systems   Review of Systems  Constitutional:  Negative for fever.  Gastrointestinal:  Positive for abdominal pain, blood in stool, hematochezia and vomiting.       Denies melena  Genitourinary:  Negative for vaginal bleeding.    Physical Exam Updated Vital Signs BP 122/84 (BP Location: Right Arm)   Pulse 88   Temp 98.2 F (36.8 C) (Oral)   Resp 16   Ht 1.6 m (5\' 3" )   Wt 49.9 kg   SpO2 100%   BMI 19.49 kg/m  Physical Exam CONSTITUTIONAL: Well developed/well nourished HEAD: Normocephalic/atraumatic EYES: EOMI/PERRL, conjunctiva pink ENMT: Mucous membranes moist NECK: supple no meningeal signs CV: S1/S2 noted, no murmurs/rubs/gallops noted LUNGS: Lungs are clear to auscultation  bilaterally, no apparent distress ABDOMEN: soft, mild lower abdominal tenderness, no rebound or guarding, bowel sounds noted throughout abdomen GU:no cva tenderness Rectal-chaperoned by nurse tech.  No hemorrhoids, no abscess or mass.  No blood or melena.  Hemoccult negative NEURO: Pt is awake/alert/appropriate, moves all extremitiesx4.  No facial droop.   EXTREMITIES: pulses normal/equal, full ROM SKIN: warm, color normal PSYCH: no abnormalities of mood noted, alert and oriented to situation  ED Results / Procedures / Treatments   Labs (all labs ordered are listed, but only abnormal results are displayed) Labs Reviewed  COMPREHENSIVE METABOLIC PANEL - Abnormal; Notable for the following components:      Result Value   Glucose, Bld 102 (*)    Total Protein 8.2 (*)    All other components within normal limits  CBC WITH DIFFERENTIAL/PLATELET  POC OCCULT BLOOD, ED    EKG None  Radiology No results found.  Procedures Procedures    Medications Ordered in ED Medications - No data to display  ED Course/ Medical Decision Making/ A&P                                 Medical Decision Making  This patient presents to the ED for concern of rectal bleeding, this involves an extensive number of treatment options, and is a complaint that carries with it a high risk of complications  and morbidity.  The differential diagnosis includes but is not limited to hemorrhoids, anal fissure, rectal trauma, diverticulosis, upper GI bleed  Social Determinants of Health: Patient's impaired access to primary care  increases the complexity of managing their presentation  Lab Tests: I Ordered, and personally interpreted labs.  The pertinent results include: Labs overall unremarkable  Test Considered: I considered CT abdomen pelvis but patient declines  Complexity of problems addressed: Patient's presentation is most consistent with  acute presentation with potential threat to life or bodily  function  Disposition: After consideration of the diagnostic results and the patient's response to treatment,  I feel that the patent would benefit from discharge   .   Patient presented for rectal bleeding.  She reports pain with defecation.  She is low risk for serious GI bleed as she is on no daily medications and is otherwise healthy Labs unremarkable.  No signs of acute rectal bleed at this time Patient did have mild lower abdominal tenderness, but reports this is very mild. I offered CT imaging, patient declines and prefers to go home. We discussed strict ER return precautions.  Discussed use of stool softeners.  Referred to gastroenterology        Final Clinical Impression(s) / ED Diagnoses Final diagnoses:  Rectal bleeding    Rx / DC Orders ED Discharge Orders     None         Zadie Rhine, MD 06/14/23 2347

## 2023-06-14 NOTE — ED Provider Triage Note (Signed)
Emergency Medicine Provider Triage Evaluation Note  Sue Gilbert , a 22 y.o. female  was evaluated in triage.  Pt complains of rectal pain and bleeding for the past week.  She has a burning sensation with defecation.  She reports some mild abdominal pain.  No history of hemorrhoids.  Blood is mostly on the tissue and in the toilet bowl.  Review of Systems  Positive: As above Negative: As above  Physical Exam  BP 126/72 (BP Location: Left Arm)   Pulse 97   Temp 97.9 F (36.6 C) (Oral)   Resp 16   Ht 5\' 3"  (1.6 m)   Wt 49.9 kg   SpO2 100%   BMI 19.49 kg/m  Gen:   Awake, no distress   Resp:  Normal effort  MSK:   Moves extremities without difficulty  Other:  Rectal exam deferred in triage  Medical Decision Making  Medically screening exam initiated at 1:34 PM.  Appropriate orders placed.  Hunter Abdulmalik was informed that the remainder of the evaluation will be completed by another provider, this initial triage assessment does not replace that evaluation, and the importance of remaining in the ED until their evaluation is complete.  Workup initiated   Michelle Piper, Cordelia Poche 06/14/23 1335

## 2023-06-14 NOTE — ED Triage Notes (Signed)
Patient reports burning pain and bleeding with bowel movements x 1 week. Patient reports pain all the time, sometimes so bad she has nausea, and soreness when she wakes up. No hx of hemorrhoids or GI problems.

## 2023-06-29 ENCOUNTER — Ambulatory Visit: Payer: Self-pay | Admitting: Pediatrics

## 2023-07-18 ENCOUNTER — Ambulatory Visit (INDEPENDENT_AMBULATORY_CARE_PROVIDER_SITE_OTHER): Payer: No Typology Code available for payment source | Admitting: Pediatrics

## 2023-07-18 DIAGNOSIS — Z23 Encounter for immunization: Secondary | ICD-10-CM

## 2023-07-18 DIAGNOSIS — Z111 Encounter for screening for respiratory tuberculosis: Secondary | ICD-10-CM

## 2023-07-18 NOTE — Progress Notes (Signed)
 Patient here for flu vaccine and TB testing (Quantiferon).  Vaccine given by MA.  I did not see the patient.  Clifton Custard, MD

## 2023-07-20 LAB — QUANTIFERON-TB GOLD PLUS
Mitogen-NIL: >10 [IU]/mL
NIL: 0.03 [IU]/mL
QuantiFERON-TB Gold Plus: NEGATIVE
TB1-NIL: <0 [IU]/mL
TB2-NIL: <0 [IU]/mL

## 2023-07-21 ENCOUNTER — Ambulatory Visit: Payer: Self-pay | Admitting: Pediatrics

## 2023-07-27 ENCOUNTER — Ambulatory Visit: Payer: 59 | Admitting: Pediatrics

## 2023-08-25 ENCOUNTER — Ambulatory Visit: Payer: 59 | Admitting: Pediatrics

## 2023-09-08 ENCOUNTER — Ambulatory Visit (INDEPENDENT_AMBULATORY_CARE_PROVIDER_SITE_OTHER): Payer: 59 | Admitting: Pediatrics

## 2023-09-08 ENCOUNTER — Encounter: Payer: Self-pay | Admitting: Pediatrics

## 2023-09-08 ENCOUNTER — Other Ambulatory Visit (HOSPITAL_COMMUNITY)
Admission: RE | Admit: 2023-09-08 | Discharge: 2023-09-08 | Disposition: A | Discharge status: Home / Self Care | Source: Ambulatory Visit | Attending: Pediatrics | Admitting: Pediatrics

## 2023-09-08 VITALS — BP 118/70 | Ht 63.0 in | Wt 104.0 lb

## 2023-09-08 DIAGNOSIS — Z0000 Encounter for general adult medical examination without abnormal findings: Secondary | ICD-10-CM | POA: Diagnosis not present

## 2023-09-08 DIAGNOSIS — Z113 Encounter for screening for infections with a predominantly sexual mode of transmission: Secondary | ICD-10-CM | POA: Insufficient documentation

## 2023-09-08 DIAGNOSIS — K5900 Constipation, unspecified: Secondary | ICD-10-CM | POA: Diagnosis not present

## 2023-09-08 DIAGNOSIS — Z114 Encounter for screening for human immunodeficiency virus [HIV]: Secondary | ICD-10-CM | POA: Diagnosis not present

## 2023-09-08 LAB — POCT RAPID HIV: Rapid HIV, POC: NEGATIVE

## 2023-09-08 MED ORDER — POLYETHYLENE GLYCOL 3350 17 GM/SCOOP PO POWD: 17.0000 g | Freq: Every day | ORAL | 11 refills | Status: AC

## 2023-09-08 NOTE — Progress Notes (Signed)
 Adolescent Well Care Visit Sue Gilbert- Felipa Furnace is a 23 y.o. female who is here for well care.    PCP:  Clifton Custard, MD   History was provided by the patient.  Current Issues: Current concerns include seen in ER on 06/13/24 with rectal bleeding, pain with defactation, and lower abdominal pain that were felt to be due to constipation.  Recommended use of stool softeners and GI referral.  Patient reports that she continues to have large BMs and some times has blood with wiping.  She tried using miralax for a couple of days and then stopped using it.  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  no Stressors of note: no  Menstruation:   Menstrual History: currently on period, no concerns  Confidential Social History: Tobacco?  no Secondhand smoke exposure?  no Drugs/ETOH?  no Sexually Active?  no    Screenings: The patient completed the Rapid Assessment for Adolescent Preventive Services screening questionnaire and the following topics were identified as risk factors and discussed: exercise  Additional topics were discussed as part of anticipatory guidance.  PHQ-9 completed and results indicated no signs of depression.  Physical Exam:  Vitals:   09/08/23 1533  BP: 118/70  Weight: 104 lb (47.2 kg)  Height: 5\' 3"  (1.6 m)   BP 118/70 (BP Location: Right Arm, Patient Position: Sitting, Cuff Size: Small)   Ht 5\' 3"  (1.6 m)   Wt 104 lb (47.2 kg)   BMI 18.42 kg/m  Body mass index: body mass index is 18.42 kg/m. Growth %ile SmartLinks can only be used for patients less than 2 years old.  Hearing Screening  Method: Audiometry   500Hz  1000Hz  2000Hz  4000Hz   Right ear 20  20 20 20   Left ear 20 20 20 20    Vision Screening   Right eye Left eye Both eyes  Without correction     With correction 20/16 20/16 20/16     General Appearance:   alert, oriented, no acute distress  HENT: Normocephalic, no obvious abnormality, conjunctiva clear  Mouth:   Normal appearing teeth, no obvious discoloration, dental caries, or dental caps  Neck:   Supple; thyroid: no enlargement, symmetric, no tenderness/mass/nodules  Chest Not examined  Lungs:   Clear to auscultation bilaterally, normal work of breathing  Heart:   Regular rate and rhythm, S1 and S2 normal, no murmurs;   Abdomen:   Soft, non-tender, no mass, or organomegaly  GU genitalia not examined  Musculoskeletal:   Tone and strength strong and symmetrical, all extremities               Lymphatic:   No cervical adenopathy  Skin/Hair/Nails:   Skin warm, dry and intact, no rashes, no bruises or petechiae  Neurologic:   Strength, gait, and coordination normal and age-appropriate     Assessment and Plan:   1. Encounter for general adult medical examination without abnormal findings (Primary) Discussed need for pap smear at age 10 and plan to transition to adult medical provider.  Patient in agreement.   2. Screening for human immunodeficiency virus Routine screening - POCT Rapid HIV - negative  3. Routine STI screening - Urine cytology ancillary only  4. Constipation, unspecified constipation type Recommend daily use  of miralax for the next few months to give time for rectum to return to normal size.  Reviewed reasons to return to care. - polyethylene glycol powder (GLYCOLAX/MIRALAX) 17 GM/SCOOP powder; Take 17 g by mouth daily.  Dispense: 500 g; Refill: 11  BMI is appropriate for age  Hearing screening result:normal Vision screening result: normal with glasses  Return if symptoms worsen or fail to improve.Clifton Custard, MD

## 2023-09-10 LAB — URINE CYTOLOGY ANCILLARY ONLY
Chlamydia: NEGATIVE
Comment: NEGATIVE
Comment: NORMAL
Neisseria Gonorrhea: NEGATIVE
